# Patient Record
Sex: Female | Born: 1985 | Race: Black or African American | Hispanic: No | Marital: Single | Smoking: Former smoker
Health system: Southern US, Community
[De-identification: ages and names within clinical notes are randomized; demographics above are authoritative.]

## PROBLEM LIST (undated history)

## (undated) DIAGNOSIS — N83209 Unspecified ovarian cyst, unspecified side: Secondary | ICD-10-CM

## (undated) HISTORY — PX: TUBAL LIGATION: SHX77

---
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
## 2007-12-28 IMAGING — US US OB FOLLOW-UP
1 series · 13 of 28 positions shown · non-contrast
Comparison: 03/17/05 and 01/13/05.

CLINICAL DATA: AFI and growth.

 OBSTETRICAL ULTRASOUND RE-EVALUATION:

[Series 1: us ob follow-up · 0.33mm/px · 13 of 40 slices shown]
[im 2/40]
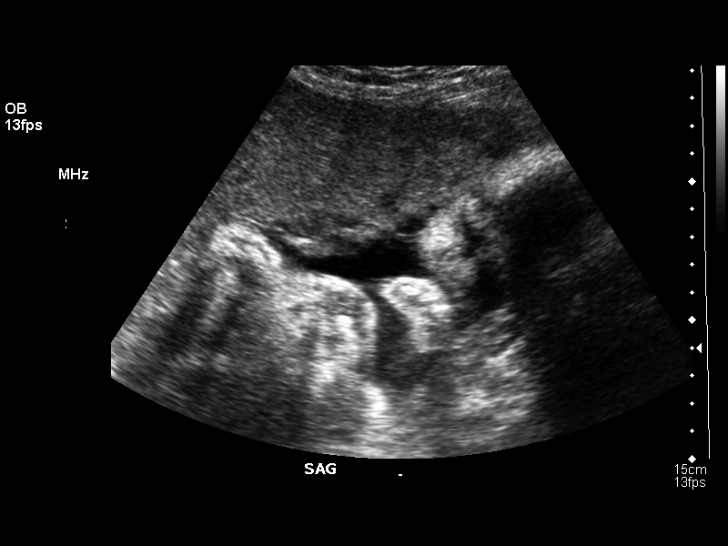
[im 5/40]
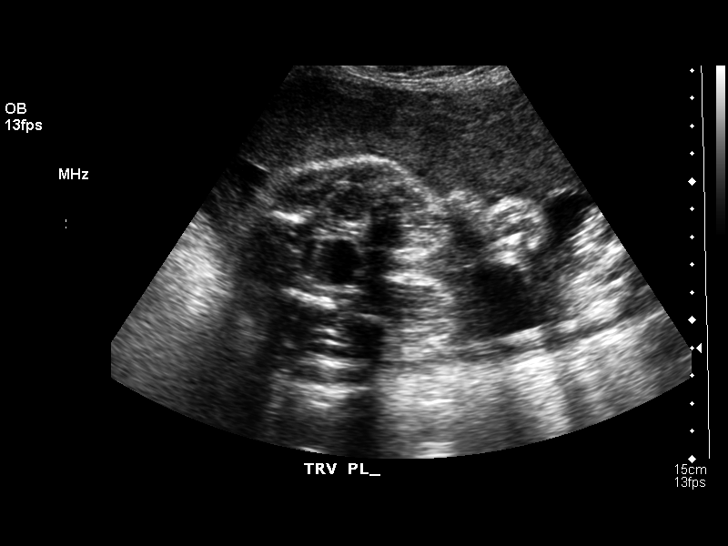
[im 8/40]
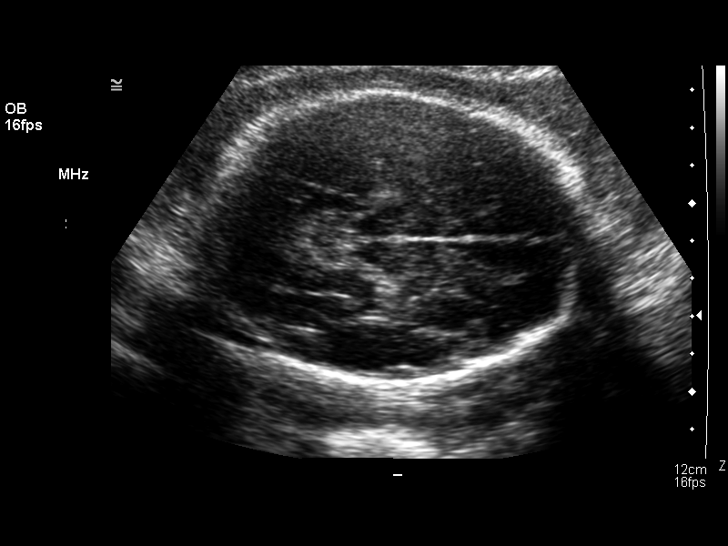
[im 11/40]
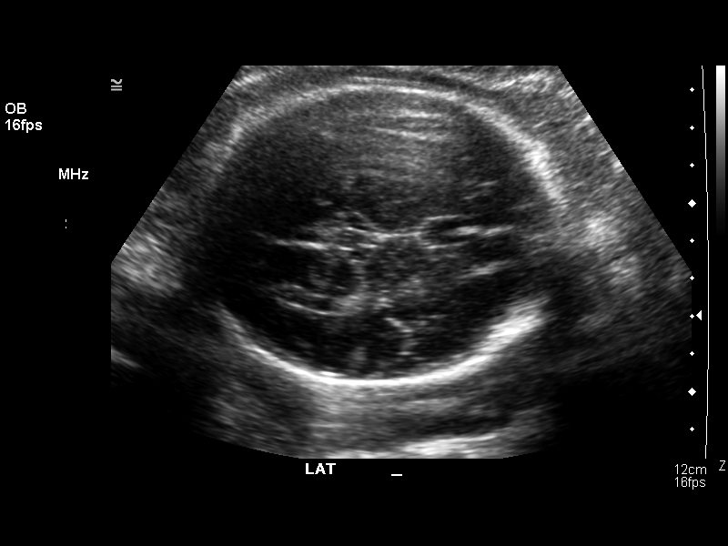
[im 14/40]
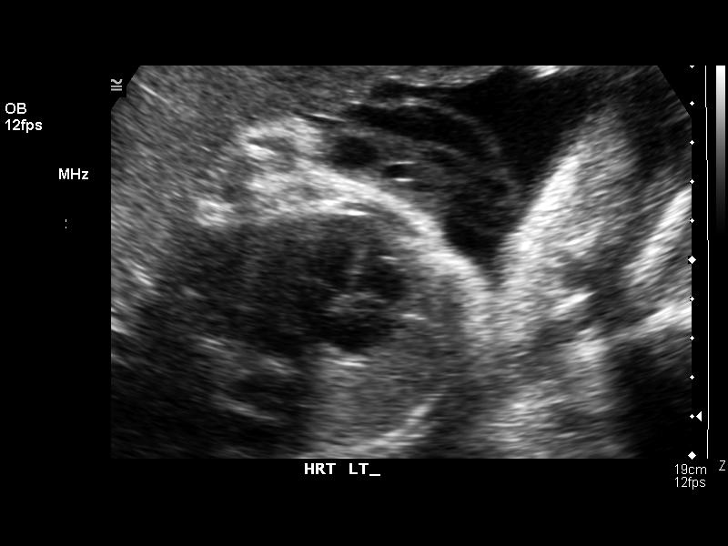
[im 16/40]
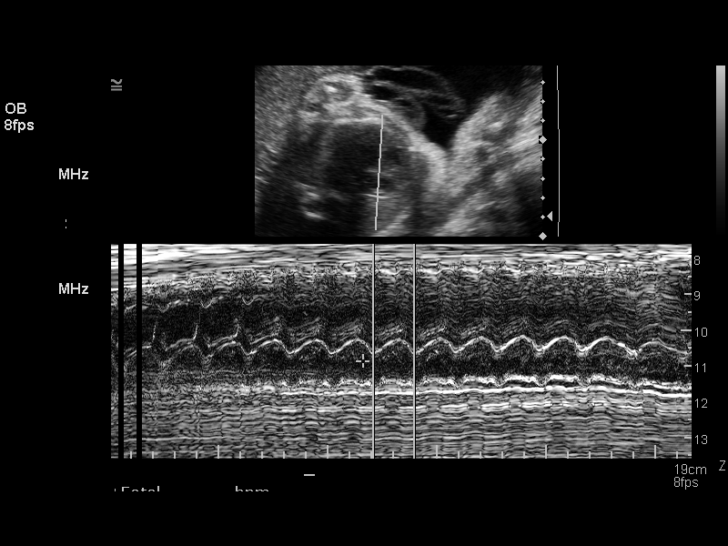
[im 21/40]
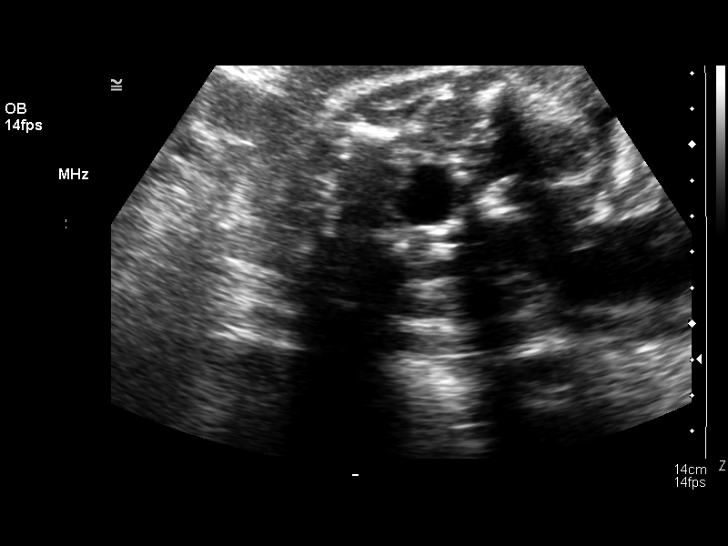
[im 24/40]
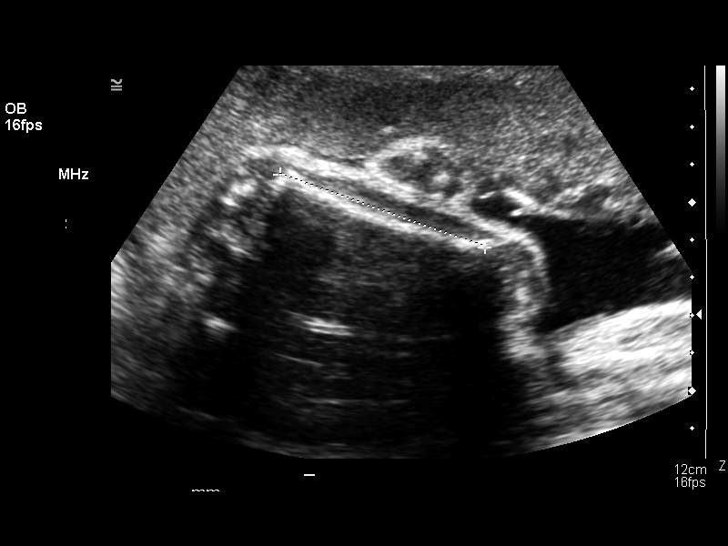
[im 27/40]
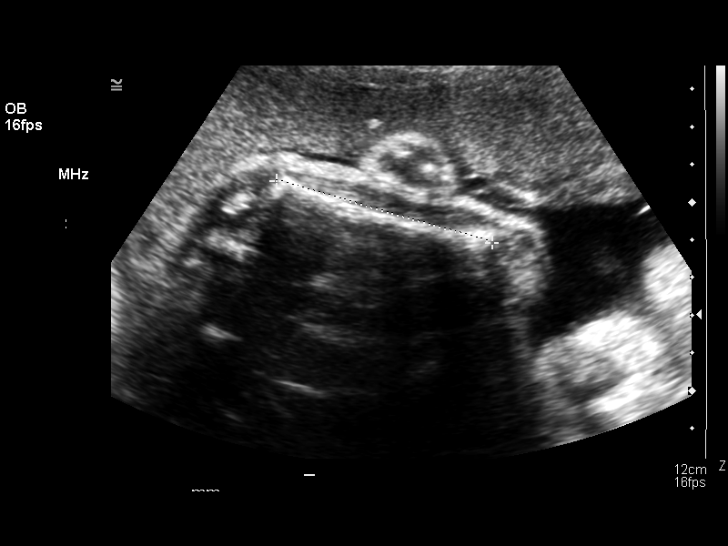
[im 29/40]
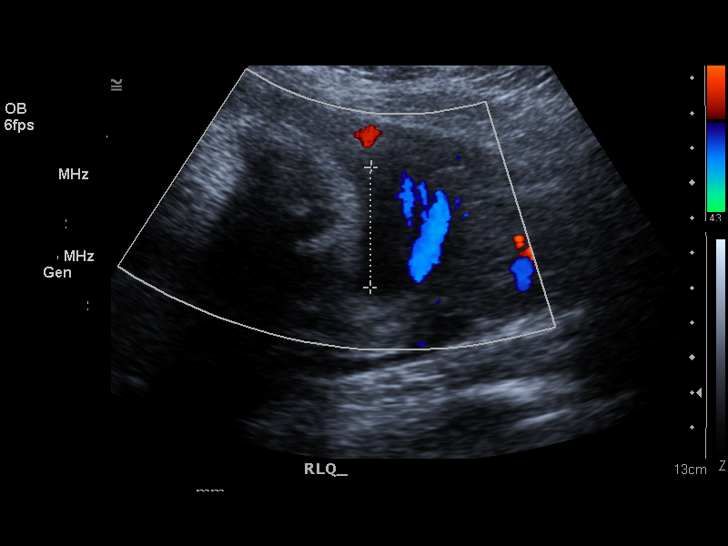
[im 32/40]
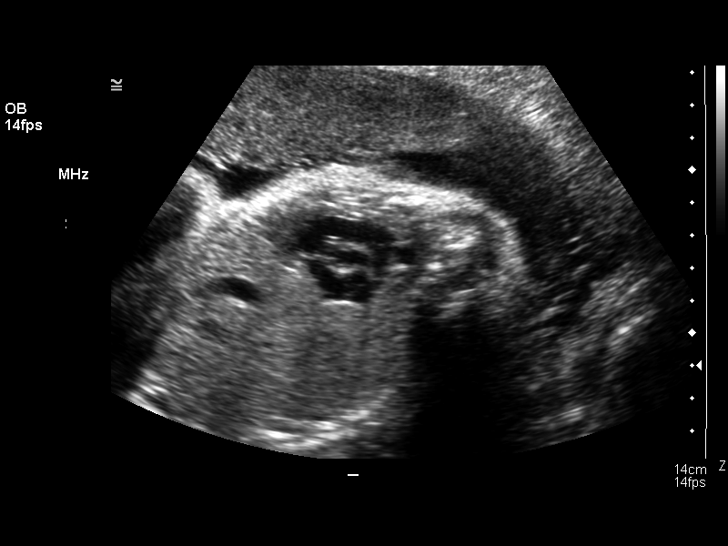
[im 35/40]
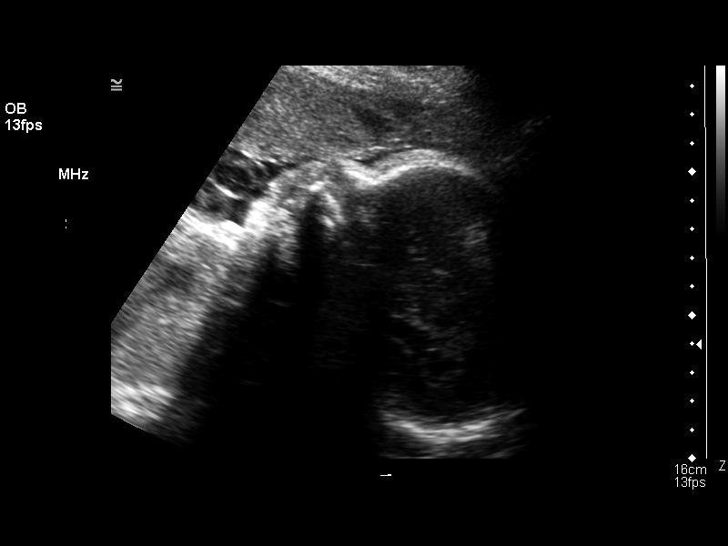
[im 38/40]
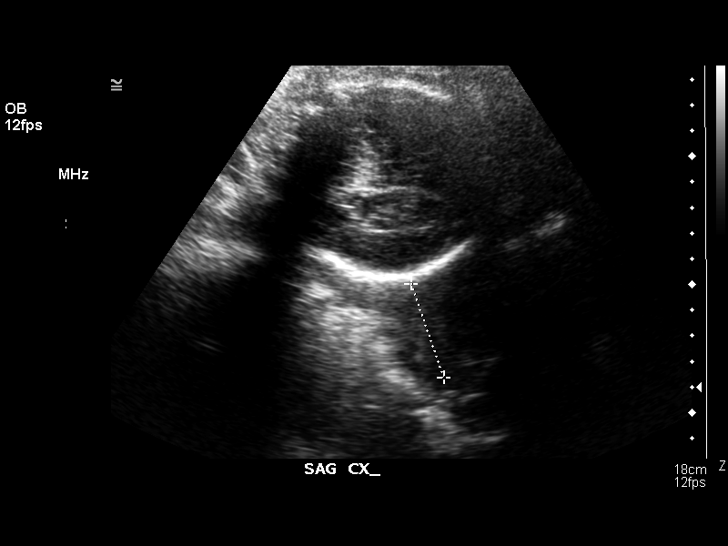

[13 of 28 positions shown; findings below may reference images not displayed]

Number of Fetuses: 1
 Heart Rate:  160
 Movement:  Yes
 Breathing:  No
 Presentation:  Cephalic
 Placental Location:  Anterior
 Grade:  I
 Previa:  No
 Amniotic Fluid (subjective):  Normal
 Amniotic Fluid (objective):  12.6 cm AFI (5th -95th%ile = 8.6 – 24.2 cm for 32 wks )

 FETAL BIOMETRY
 BPD:  7.7 cm   30 w 5 d
 HC:  28.8 cm   31 w 5 d
 AC:  26.4 cm  30 w 4 d
 FL:   5.5 cm  30 w 5 d

 Mean GA:  31 w 0 d  US EDC:  06/09/05
 Assigned GA:  31 w 6 d  Assigned EDC:  06/03/05

 EFW:  6797 g (H) 25th – 50th%ile (7800 – 7051 g) For 32 wks

 FETAL ANATOMY
 Lateral Ventricles:  Visualized 
 Thalami/CSP:  Visualized 
 Posterior Fossa:  Previously seen 
 Nuchal Region:  Previously seen 
 Spine:  Previously seen 
 4 Chamber Heart on Left:  Visualized 
 Stomach on Left:  Visualized 
 3 Vessel Cord:  Visualized 
 Cord Insertion Site:  Visualized 
 Kidneys:  Visualized 
 Bladder:  Visualized 
 Extremities:  Previously seen 

 ADDITIONAL ANATOMY VISUALIZED:  LVOT, RVOT, profile, and diaphragm.

 MATERNAL UTERINE AND ADNEXAL FINDINGS
 Cervix: 4.0 cm Transabdominally
IMPRESSION: 1.  Single living intrauterine fetus in cephalic presentation with subjectively normal amniotic fluid volume.  AFI today is 12.6 cm.  
 2.  Estimated mean gestational age is 31 weeks 0 days which correlates well with the reported assigned gestational age and suggests appropriate growth.  
 3.  Visualized fetal anatomy is unremarkable although assessment is limited by the advanced gestational age.  A nuchal cord was documented on the previous study, but this is less apparent today.

## 2010-02-06 ENCOUNTER — Encounter: Payer: Self-pay | Admitting: *Deleted

## 2015-06-23 ENCOUNTER — Emergency Department: Payer: Self-pay

## 2015-06-23 ENCOUNTER — Emergency Department (HOSPITAL_BASED_OUTPATIENT_CLINIC_OR_DEPARTMENT_OTHER)
Admission: EM | Admit: 2015-06-23 | Discharge: 2015-06-23 | Disposition: A | Discharge status: Home / Self Care | Payer: Self-pay | Attending: Emergency Medicine | Admitting: Emergency Medicine

## 2015-06-23 ENCOUNTER — Emergency Department (HOSPITAL_BASED_OUTPATIENT_CLINIC_OR_DEPARTMENT_OTHER): Payer: Self-pay

## 2015-06-23 ENCOUNTER — Encounter (HOSPITAL_BASED_OUTPATIENT_CLINIC_OR_DEPARTMENT_OTHER): Payer: Self-pay

## 2015-06-23 DIAGNOSIS — F17210 Nicotine dependence, cigarettes, uncomplicated: Secondary | ICD-10-CM | POA: Insufficient documentation

## 2015-06-23 DIAGNOSIS — R109 Unspecified abdominal pain: Secondary | ICD-10-CM

## 2015-06-23 DIAGNOSIS — N390 Urinary tract infection, site not specified: Secondary | ICD-10-CM | POA: Insufficient documentation

## 2015-06-23 DIAGNOSIS — N72 Inflammatory disease of cervix uteri: Secondary | ICD-10-CM | POA: Insufficient documentation

## 2015-06-23 LAB — URINE MICROSCOPIC-ADD ON

## 2015-06-23 LAB — URINALYSIS, ROUTINE W REFLEX MICROSCOPIC
BILIRUBIN URINE: NEGATIVE
GLUCOSE, UA: NEGATIVE mg/dL
KETONES UR: NEGATIVE mg/dL
Nitrite: NEGATIVE
PROTEIN: NEGATIVE mg/dL
Specific Gravity, Urine: 1.018 (ref 1.005–1.030)
pH: 7.5 (ref 5.0–8.0)

## 2015-06-23 LAB — PREGNANCY, URINE: Preg Test, Ur: NEGATIVE

## 2015-06-23 LAB — WET PREP, GENITAL
Clue Cells Wet Prep HPF POC: NONE SEEN
SPERM: NONE SEEN
TRICH WET PREP: NONE SEEN
Yeast Wet Prep HPF POC: NONE SEEN

## 2015-06-23 MED ORDER — LIDOCAINE HCL (PF) 1 % IJ SOLN
INTRAMUSCULAR | Status: AC
Start: 2015-06-23 — End: 2015-06-23
  Administered 2015-06-23: 0.9 mL
  Filled 2015-06-23: qty 5

## 2015-06-23 MED ORDER — ONDANSETRON 4 MG PO TBDP
4.0000 mg | ORAL_TABLET | Freq: Once | ORAL | Status: AC
  Administered 2015-06-23: 4 mg via ORAL
  Filled 2015-06-23: qty 1

## 2015-06-23 MED ORDER — CEPHALEXIN 500 MG PO CAPS: 500.0000 mg | ORAL_CAPSULE | Freq: Four times a day (QID) | ORAL | Status: DC

## 2015-06-23 MED ORDER — CEFTRIAXONE SODIUM 250 MG IJ SOLR
250.0000 mg | Freq: Once | INTRAMUSCULAR | Status: AC
  Administered 2015-06-23: 250 mg via INTRAMUSCULAR
  Filled 2015-06-23: qty 250

## 2015-06-23 MED ORDER — AZITHROMYCIN 250 MG PO TABS
1000.0000 mg | ORAL_TABLET | Freq: Once | ORAL | Status: AC
  Administered 2015-06-23: 1000 mg via ORAL
  Filled 2015-06-23: qty 4

## 2015-06-23 NOTE — ED Notes (Signed)
Patient states she is having vaginal pain. States that she had sexual intercourse 2 days ago and has pain since that time. Patient reports bleeding during sex. It is previously documented that the patient is on her menstrual cycle, patient denies that at this time.

## 2015-06-23 NOTE — ED Notes (Signed)
Patient transported to Ultrasound 

## 2015-06-23 NOTE — ED Notes (Signed)
Patient complains of 2 days of vaginal pain following sex. States bleeding with same but stats sh on her menstrual cycle. denies dysuria

## 2015-06-23 NOTE — ED Provider Notes (Signed)
CSN: [id number]     Arrival date & time 06/23/15  1418 History   First MD Initiated Contact with Patient 06/23/15 1508     Chief Complaint  Patient presents with  . Vaginal Pain     (Consider location/radiation/quality/duration/timing/severity/associated sxs/prior Treatment) HPI Comments: 30 year old female G7 P5 presents for pain with intercourse as well as bleeding after intercourse. The patient reports this is been ongoing over the last 2 days. She reports that she had a normal menses 1 week ago that ended a few days before the bleeding with intercourse began. She reports mild suprapubic discomfort. Denies abdominal pain. Denies fevers or chills. Reports normal bowel movements. Normal appetite and diet. Denies pain with urination or urgency or frequency.  Patient is a 30 y.o. female presenting with vaginal pain.  Vaginal Pain Pertinent negatives include no chest pain, no abdominal pain, no headaches and no shortness of breath.    History reviewed. No pertinent past medical history. History reviewed. No pertinent past surgical history. No family history on file. Social History  Substance Use Topics  . Smoking status: Current Every Day Smoker    Types: Cigarettes  . Smokeless tobacco: None  . Alcohol Use: None   OB History    No data available     Review of Systems  Constitutional: Negative for chills and fatigue.  HENT: Negative for congestion and rhinorrhea.   Eyes: Negative for visual disturbance.  Respiratory: Negative for cough, chest tightness and shortness of breath.   Cardiovascular: Negative for chest pain and palpitations.  Gastrointestinal: Negative for nausea, vomiting, abdominal pain and diarrhea.  Genitourinary: Positive for vaginal pain and pelvic pain. Negative for dysuria, urgency, frequency, flank pain, vaginal bleeding and vaginal discharge.  Musculoskeletal: Negative for myalgias and back pain.  Skin: Negative for rash.  Neurological: Negative for  dizziness, weakness, light-headedness and headaches.  Hematological: Does not bruise/bleed easily.      Allergies  Review of patient's allergies indicates no known allergies.  Home Medications   Prior to Admission medications   Medication Sig Start Date End Date Taking? Authorizing Provider  cephALEXin (KEFLEX) 500 MG capsule Take 1 capsule (500 mg total) by mouth 4 (four) times daily. 06/23/15   Leta BaptistEmily Roe Nguyen, MD   BP 94/62 mmHg  Pulse 60  Temp(Src) 99.3 F (37.4 C)  Resp 16  Ht 5\' 5"  (1.651 m)  Wt 200 lb (90.719 kg)  BMI 33.28 kg/m2  SpO2 100%  LMP 06/16/2015 (Approximate) Physical Exam  Constitutional: She is oriented to person, place, and time. She appears well-developed and well-nourished. No distress.  HENT:  Head: Normocephalic and atraumatic.  Right Ear: External ear normal.  Left Ear: External ear normal.  Nose: Nose normal.  Mouth/Throat: Oropharynx is clear and moist. No oropharyngeal exudate.  Eyes: EOM are normal. Pupils are equal, round, and reactive to light.  Neck: Normal range of motion. Neck supple.  Cardiovascular: Normal rate, regular rhythm, normal heart sounds and intact distal pulses.   No murmur heard. Pulmonary/Chest: Effort normal. No respiratory distress. She has no wheezes. She has no rales.  Abdominal: Soft. She exhibits no distension. There is no tenderness. Hernia confirmed negative in the right inguinal area and confirmed negative in the left inguinal area.  Genitourinary: There is no tenderness on the right labia. There is no tenderness on the left labia. Uterus is not enlarged. Cervix exhibits motion tenderness. Cervix exhibits no discharge. Right adnexum displays tenderness. Right adnexum displays no mass and no fullness. Left adnexum  displays no mass, no tenderness and no fullness. There is tenderness and bleeding (scant blood in the vaginal vault) in the vagina. No foreign body around the vagina. No signs of injury around the vagina. No  vaginal discharge found.  Musculoskeletal: Normal range of motion. She exhibits no edema or tenderness.  Neurological: She is alert and oriented to person, place, and time.  Skin: Skin is warm and dry. No rash noted. She is not diaphoretic.  Vitals reviewed.   ED Course  Procedures (including critical care time) Labs Review Labs Reviewed  WET PREP, GENITAL - Abnormal; Notable for the following:    WBC, Wet Prep HPF POC MANY (*)    All other components within normal limits  URINALYSIS, ROUTINE W REFLEX MICROSCOPIC (NOT AT [facility]) - Abnormal; Notable for the following:    APPearance CLOUDY (*)    Hgb urine dipstick TRACE (*)    Leukocytes, UA SMALL (*)    All other components within normal limits  URINE MICROSCOPIC-ADD ON - Abnormal; Notable for the following:    Squamous Epithelial / LPF 0-5 (*)    Bacteria, UA MANY (*)    All other components within normal limits  PREGNANCY, URINE  HIV ANTIBODY (ROUTINE TESTING)  GC/CHLAMYDIA PROBE AMP ([place]) NOT AT [facility]    Imaging Review US Transvaginal Non-ob  06/23/2015  CLINICAL DATA:  RIGHT adnexal pain on pelvic exam today, vaginal pain since intercourse 2 days ago, some bleeding, initial encounter EXAM: TRANSABDOMINAL AND TRANSVAGINAL ULTRASOUND OF PELVIS TECHNIQUE: Both transabdominal and transvaginal ultrasound examinations of the pelvis were performed. Transabdominal technique was performed for global imaging of the pelvis including uterus, ovaries, adnexal regions, and pelvic cul-de-sac. It was necessary to proceed with endovaginal exam following the transabdominal exam to visualize the endometrium and RIGHT ovary. COMPARISON:  None FINDINGS: Uterus Measurements: 8.7 x 4.1 x 4.5 cm. Heterogeneous myometrial echogenicity without focal mass. Endometrium Thickness: 5 mm thick, normal. No endometrial fluid or focal abnormality Right ovary Measurements: 4.1 x 2.5 x 2.7 cm. Numerous peripheral follicles, nonspecific but has been described in  patients with polycystic ovary disease. No dominant RIGHT ovarian mass. Left ovary Measurements: 3.4 x 2.2 x 2.3 cm. Numerous peripheral follicles though less prominent than on RIGHT. No dominant mass. Other findings No free fluid or adnexal masses. IMPRESSION: Unremarkable uterus and endometrium. Numerous peripheral follicles in the ovaries, slightly more prominent on RIGHT, nonspecific but has been described in patients with polycystic ovarian disease. Electronically Signed   By: Ulyses Southward M.D.   On: 06/23/2015 17:41   US Pelvis Complete  06/23/2015  CLINICAL DATA:  RIGHT adnexal pain on pelvic exam today, vaginal pain since intercourse 2 days ago, some bleeding, initial encounter EXAM: TRANSABDOMINAL AND TRANSVAGINAL ULTRASOUND OF PELVIS TECHNIQUE: Both transabdominal and transvaginal ultrasound examinations of the pelvis were performed. Transabdominal technique was performed for global imaging of the pelvis including uterus, ovaries, adnexal regions, and pelvic cul-de-sac. It was necessary to proceed with endovaginal exam following the transabdominal exam to visualize the endometrium and RIGHT ovary. COMPARISON:  None FINDINGS: Uterus Measurements: 8.7 x 4.1 x 4.5 cm. Heterogeneous myometrial echogenicity without focal mass. Endometrium Thickness: 5 mm thick, normal. No endometrial fluid or focal abnormality Right ovary Measurements: 4.1 x 2.5 x 2.7 cm. Numerous peripheral follicles, nonspecific but has been described in patients with polycystic ovary disease. No dominant RIGHT ovarian mass. Left ovary Measurements: 3.4 x 2.2 x 2.3 cm. Numerous peripheral follicles though less prominent than on RIGHT. No  dominant mass. Other findings No free fluid or adnexal masses. IMPRESSION: Unremarkable uterus and endometrium. Numerous peripheral follicles in the ovaries, slightly more prominent on RIGHT, nonspecific but has been described in patients with polycystic ovarian disease. Electronically Signed   By: Ulyses Southward M.D.   On: 06/23/2015 17:41   I have personally reviewed and evaluated these images and lab results as part of my medical decision-making.   EKG Interpretation None      MDM  Patient seen and evaluated in stable condition. Examination consistent with cervicitis. Concern for tenderness on the right adnexa. Ultrasound showed numerous peripheral follicles in the ovaries slightly more prominent on right than left but otherwise were unremarkable. UA concerning for possible infection. Patient was treated for STDs with Rocephin and azithromycin. Results were discussed at bedside with her. She was discharged home in stable condition with a prescription for Keflex. Return precautions and instructed to inform her sexual partners that she was evaluated and treated for STDs. Final diagnoses:  Cervicitis  UTI (lower urinary tract infection)    1. Cervicitis 2. UTI    Leta Baptist, MD 06/24/15 0111

## 2015-06-23 NOTE — Discharge Instructions (Signed)
You were seen and evaluated today for your pain with intercourse and bleeding. Likely this is related to infection in your cervix. Most likely this is secondary to a sexually transmitted disease. You have been treated for sexually transmitted diseases. Your cultures will take 2-3 days to result. Please inform any sexual partners that he were evaluated for this and that they need to be evaluated as well. You are also found on your ultrasound to have cysts on her ovary. Please follow-up within OB/GYN regarding this as this will need further workup outpatient. You should also address the pain with intercourse and bleeding with them if it continues.  Cervicitis Cervicitis is a soreness and swelling (inflammation) of the cervix. Your cervix is located at the bottom of your uterus. It opens up to the vagina. CAUSES   Sexually transmitted infections (STIs).   Allergic reaction.   Medicines or birth control devices that are put in the vagina.   Injury to the cervix.   Bacterial infections.  RISK FACTORS You are at greater risk if you:  Have unprotected sexual intercourse.  Have sexual intercourse with many partners.  Began sexual intercourse at an early age.  Have a history of STIs. SYMPTOMS  There may be no symptoms. If symptoms occur, they may include:   Gray, white, yellow, or bad-smelling vaginal discharge.   Pain or itching of the area outside the vagina.   Painful sexual intercourse.   Lower abdominal or lower back pain, especially during intercourse.   Frequent urination.   Abnormal vaginal bleeding between periods, after sexual intercourse, or after menopause.   Pressure or a heavy feeling in the pelvis.  DIAGNOSIS  Diagnosis is made after a pelvic exam. Other tests may include:   Examination of any discharge under a microscope (wet prep).   A Pap test.  TREATMENT  Treatment will depend on the cause of cervicitis. If it is caused by an STI, both you and  your partner will need to be treated. Antibiotic medicines will be given.  HOME CARE INSTRUCTIONS   Do not have sexual intercourse until your health care provider says it is okay.   Do not have sexual intercourse until your partner has been treated, if your cervicitis is caused by an STI.   Take your antibiotics as directed. Finish them even if you start to feel better.  SEEK MEDICAL CARE IF:  Your symptoms come back.   You have a fever.  MAKE SURE YOU:   Understand these instructions.  Will watch your condition.  Will get help right away if you are not doing well or get worse.   This information is not intended to replace advice given to you by your health care provider. Make sure you discuss any questions you have with your health care provider.   Document Released: 01/02/2005 Document Revised: 01/07/2013 Document Reviewed: 06/26/2012 Elsevier Interactive Patient Education 2016 Elsevier Inc.  Urinary Tract Infection Urinary tract infections (UTIs) can develop anywhere along your urinary tract. Your urinary tract is your body's drainage system for removing wastes and extra water. Your urinary tract includes two kidneys, two ureters, a bladder, and a urethra. Your kidneys are a pair of bean-shaped organs. Each kidney is about the size of your fist. They are located below your ribs, one on each side of your spine. CAUSES Infections are caused by microbes, which are microscopic organisms, including fungi, viruses, and bacteria. These organisms are so small that they can only be seen through a microscope. Bacteria  are the microbes that most commonly cause UTIs. SYMPTOMS  Symptoms of UTIs may vary by age and gender of the patient and by the location of the infection. Symptoms in young women typically include a frequent and intense urge to urinate and a painful, burning feeling in the bladder or urethra during urination. Older women and men are more likely to be tired, shaky, and weak  and have muscle aches and abdominal pain. A fever may mean the infection is in your kidneys. Other symptoms of a kidney infection include pain in your back or sides below the ribs, nausea, and vomiting. DIAGNOSIS To diagnose a UTI, your caregiver will ask you about your symptoms. Your caregiver will also ask you to provide a urine sample. The urine sample will be tested for bacteria and white blood cells. White blood cells are made by your body to help fight infection. TREATMENT  Typically, UTIs can be treated with medication. Because most UTIs are caused by a bacterial infection, they usually can be treated with the use of antibiotics. The choice of antibiotic and length of treatment depend on your symptoms and the type of bacteria causing your infection. HOME CARE INSTRUCTIONS  If you were prescribed antibiotics, take them exactly as your caregiver instructs you. Finish the medication even if you feel better after you have only taken some of the medication.  Drink enough water and fluids to keep your urine clear or pale yellow.  Avoid caffeine, tea, and carbonated beverages. They tend to irritate your bladder.  Empty your bladder often. Avoid holding urine for long periods of time.  Empty your bladder before and after sexual intercourse.  After a bowel movement, women should cleanse from front to back. Use each tissue only once. SEEK MEDICAL CARE IF:   You have back pain.  You develop a fever.  Your symptoms do not begin to resolve within 3 days. SEEK IMMEDIATE MEDICAL CARE IF:   You have severe back pain or lower abdominal pain.  You develop chills.  You have nausea or vomiting.  You have continued burning or discomfort with urination. MAKE SURE YOU:   Understand these instructions.  Will watch your condition.  Will get help right away if you are not doing well or get worse.   This information is not intended to replace advice given to you by your health care provider.  Make sure you discuss any questions you have with your health care provider.   Document Released: 10/12/2004 Document Revised: 09/23/2014 Document Reviewed: 02/10/2011 Elsevier Interactive Patient Education [employer]2016 Elsevier Inc.

## 2015-06-24 LAB — GC/CHLAMYDIA PROBE AMP (~~LOC~~) NOT AT ARMC
Chlamydia: NEGATIVE
Neisseria Gonorrhea: NEGATIVE

## 2015-06-24 LAB — HIV ANTIBODY (ROUTINE TESTING W REFLEX): HIV Screen 4th Generation wRfx: NONREACTIVE

## 2015-09-23 ENCOUNTER — Encounter (HOSPITAL_COMMUNITY): Payer: Self-pay | Admitting: Emergency Medicine

## 2015-09-23 ENCOUNTER — Emergency Department (HOSPITAL_COMMUNITY)
Admission: EM | Admit: 2015-09-23 | Discharge: 2015-09-23 | Disposition: A | Discharge status: Home / Self Care | Payer: Self-pay | Attending: Emergency Medicine | Admitting: Emergency Medicine

## 2015-09-23 DIAGNOSIS — A599 Trichomoniasis, unspecified: Secondary | ICD-10-CM | POA: Insufficient documentation

## 2015-09-23 DIAGNOSIS — F17210 Nicotine dependence, cigarettes, uncomplicated: Secondary | ICD-10-CM | POA: Insufficient documentation

## 2015-09-23 LAB — URINALYSIS, ROUTINE W REFLEX MICROSCOPIC
Bilirubin Urine: NEGATIVE
GLUCOSE, UA: NEGATIVE mg/dL
Ketones, ur: NEGATIVE mg/dL
Nitrite: NEGATIVE
Protein, ur: NEGATIVE mg/dL
SPECIFIC GRAVITY, URINE: 1.022 (ref 1.005–1.030)
pH: 6.5 (ref 5.0–8.0)

## 2015-09-23 LAB — URINE MICROSCOPIC-ADD ON

## 2015-09-23 LAB — WET PREP, GENITAL
Sperm: NONE SEEN
Yeast Wet Prep HPF POC: NONE SEEN

## 2015-09-23 MED ORDER — CEFTRIAXONE SODIUM 250 MG IJ SOLR
250.0000 mg | Freq: Once | INTRAMUSCULAR | Status: AC
Start: 2015-09-23 — End: 2015-09-23
  Administered 2015-09-23: 250 mg via INTRAMUSCULAR
  Filled 2015-09-23: qty 250

## 2015-09-23 MED ORDER — METRONIDAZOLE 500 MG PO TABS: 500.0000 mg | ORAL_TABLET | Freq: Two times a day (BID) | ORAL | 0 refills | Status: DC

## 2015-09-23 MED ORDER — STERILE WATER FOR INJECTION IJ SOLN
0.9000 mL | INTRAMUSCULAR | Status: AC
  Administered 2015-09-23: 0.9 mL via INTRAMUSCULAR

## 2015-09-23 MED ORDER — STERILE WATER FOR INJECTION IJ SOLN
INTRAMUSCULAR | Status: AC
  Filled 2015-09-23: qty 10

## 2015-09-23 MED ORDER — DOXYCYCLINE HYCLATE 100 MG PO CAPS: 100.0000 mg | ORAL_CAPSULE | Freq: Two times a day (BID) | ORAL | 0 refills | Status: DC

## 2015-09-23 NOTE — ED Triage Notes (Signed)
Patient states abdominal pain x 2 days.   Patient states some white vaginal discharge with pain.   Denies other symptoms.

## 2015-09-23 NOTE — ED Notes (Signed)
Crackers given to pt ?

## 2015-09-23 NOTE — ED Notes (Signed)
PA and Resident MD at bedside.

## 2015-09-23 NOTE — Discharge Instructions (Signed)
Return here as needed.  Follow-up with the clinic provided.  °

## 2015-09-24 LAB — GC/CHLAMYDIA PROBE AMP (~~LOC~~) NOT AT ARMC
Chlamydia: NEGATIVE
Neisseria Gonorrhea: NEGATIVE

## 2015-09-24 NOTE — ED Provider Notes (Signed)
[facility]   CSN: [id number] Arrival date & time: 09/23/15  1004     History   Chief Complaint Chief Complaint  Patient presents with  . Abdominal Pain    HPI Taylor Butler is a 30 y.o. female.  HPI   Patient presents to the emergency department with lower pelvic pain with vaginal discharge.  The patient states that this started 3 days ago.  The patient states she was recently seen for similar symptoms.  The patient states that nothing seems make the condition better or worse.  She did not take any medications prior to arrival.  She denies any vaginal bleeding.  The patient denies chest pain, shortness of breath, headache,blurred vision, neck pain, fever, cough, weakness, numbness, dizziness, anorexia, edema, , nausea, vomiting, diarrhea, rash, back pain, dysuria, hematemesis, bloody stool, near syncope, or syncope. History reviewed. No pertinent past medical history.  There are no active problems to display for this patient.   Past Surgical History:  Procedure Laterality Date  . CESAREAN SECTION    . TUBAL LIGATION      OB History    No data available       Home Medications    Prior to Admission medications   Medication Sig Start Date End Date Taking? Authorizing Provider  cephALEXin (KEFLEX) 500 MG capsule Take 1 capsule (500 mg total) by mouth 4 (four) times daily. Patient not taking: Reported on 09/23/2015 06/23/15   Leta BaptistEmily Roe Nguyen, MD  doxycycline (VIBRAMYCIN) 100 MG capsule Take 1 capsule (100 mg total) by mouth 2 (two) times daily. 09/23/15   Charlestine Nighthristopher Taran Haynesworth, PA-C  metroNIDAZOLE (FLAGYL) 500 MG tablet Take 1 tablet (500 mg total) by mouth 2 (two) times daily. 09/23/15   Charlestine Nighthristopher Leyana Whidden, PA-C    Family History No family history on file.  Social History Social History  Substance Use Topics  . Smoking status: Current Some Day Smoker    Types: Cigars  . Smokeless tobacco: Not on file  . Alcohol use No     Allergies   Review  of patient's allergies indicates no known allergies.   Review of Systems Review of Systems All other systems negative except as documented in the HPI. All pertinent positives and negatives as reviewed in the HPI.=  Physical Exam Updated Vital Signs BP 106/71 (BP Location: Right Arm)   Pulse 73   Temp 98.4 F (36.9 C) (Oral)   Resp 16   Ht 5\' 5"  (1.651 m)   Wt 94.9 kg   LMP 08/20/2015   SpO2 100%   BMI 34.81 kg/m   Physical Exam  Constitutional: She is oriented to person, place, and time. She appears well-developed and well-nourished. No distress.  HENT:  Head: Normocephalic and atraumatic.  Mouth/Throat: Oropharynx is clear and moist.  Eyes: Pupils are equal, round, and reactive to light.  Neck: Normal range of motion. Neck supple.  Cardiovascular: Normal rate, regular rhythm and normal heart sounds.  Exam reveals no gallop and no friction rub.   No murmur heard. Pulmonary/Chest: Effort normal and breath sounds normal. No respiratory distress. She has no wheezes.  Abdominal: Soft. Bowel sounds are normal. She exhibits no distension. There is no tenderness.  Genitourinary: Cervix exhibits discharge. Cervix exhibits no motion tenderness and no friability. Right adnexum displays tenderness. Left adnexum displays tenderness. Vaginal discharge found.  Neurological: She is alert and oriented to person, place, and time. She exhibits normal muscle tone. Coordination normal.  Skin: Skin is warm and  dry. No rash noted. No erythema.  Psychiatric: She has a normal mood and affect. Her behavior is normal.  Nursing note and vitals reviewed.    ED Treatments / Results  Labs (all labs ordered are listed, but only abnormal results are displayed) Labs Reviewed  WET PREP, GENITAL - Abnormal; Notable for the following:       Result Value   Trich, Wet Prep PRESENT (*)    Clue Cells Wet Prep HPF POC PRESENT (*)    WBC, Wet Prep HPF POC MODERATE (*)    All other components within normal  limits  URINALYSIS, ROUTINE W REFLEX MICROSCOPIC (NOT AT [facility]) - Abnormal; Notable for the following:    APPearance TURBID (*)    Hgb urine dipstick MODERATE (*)    Leukocytes, UA MODERATE (*)    All other components within normal limits  URINE MICROSCOPIC-ADD ON - Abnormal; Notable for the following:    Squamous Epithelial / LPF 6-30 (*)    Bacteria, UA MANY (*)    All other components within normal limits  GC/CHLAMYDIA PROBE AMP ([place]) NOT AT [facility]    EKG  EKG Interpretation None       Radiology No results found.  Procedures Procedures (including critical care time)  Medications Ordered in ED Medications  cefTRIAXone (ROCEPHIN) injection 250 mg (250 mg Intramuscular Given 09/23/15 1516)  sterile water (preservative free) injection 0.9 mL (0.9 mLs Injection Given 09/23/15 1517)     Initial Impression / Assessment and Plan / ED Course  I have reviewed the triage vital signs and the nursing notes.  Pertinent labs & imaging results that were available during my care of the patient were reviewed by me and considered in my medical decision making (see chart for details).  Clinical Course    Patient be treated for STDs.  Told to return here as needed.  Also treated for PID based on the fact that she has lower pelvic pain on exam  Final Clinical Impressions(s) / ED Diagnoses   Final diagnoses:  Trichomoniasis    New Prescriptions Discharge Medication List as of 09/23/2015  3:44 PM    START taking these medications   Details  doxycycline (VIBRAMYCIN) 100 MG capsule Take 1 capsule (100 mg total) by mouth 2 (two) times daily., Starting Thu 09/23/2015, Print    metroNIDAZOLE (FLAGYL) 500 MG tablet Take 1 tablet (500 mg total) by mouth 2 (two) times daily., Starting Thu 09/23/2015, Print         [employer], PA-C 09/24/15 1724    Linwood Dibbles, MD 09/26/15 [phone]

## 2016-08-25 ENCOUNTER — Inpatient Hospital Stay (HOSPITAL_COMMUNITY): Payer: Self-pay

## 2016-08-25 ENCOUNTER — Encounter (HOSPITAL_COMMUNITY): Payer: Self-pay

## 2016-08-25 ENCOUNTER — Inpatient Hospital Stay (HOSPITAL_COMMUNITY)
Admission: AD | Admit: 2016-08-25 | Discharge: 2016-08-25 | Disposition: A | Discharge status: Home / Self Care | Payer: Self-pay | Source: Ambulatory Visit | Attending: Family Medicine | Admitting: Family Medicine

## 2016-08-25 DIAGNOSIS — R102 Pelvic and perineal pain: Secondary | ICD-10-CM | POA: Insufficient documentation

## 2016-08-25 DIAGNOSIS — F17290 Nicotine dependence, other tobacco product, uncomplicated: Secondary | ICD-10-CM | POA: Insufficient documentation

## 2016-08-25 DIAGNOSIS — N83201 Unspecified ovarian cyst, right side: Secondary | ICD-10-CM | POA: Insufficient documentation

## 2016-08-25 LAB — URINALYSIS, ROUTINE W REFLEX MICROSCOPIC
BILIRUBIN URINE: NEGATIVE
GLUCOSE, UA: NEGATIVE mg/dL
KETONES UR: NEGATIVE mg/dL
NITRITE: NEGATIVE
PH: 5 (ref 5.0–8.0)
Protein, ur: NEGATIVE mg/dL
Specific Gravity, Urine: 1.018 (ref 1.005–1.030)

## 2016-08-25 LAB — POCT PREGNANCY, URINE: Preg Test, Ur: NEGATIVE

## 2016-08-25 MED ORDER — IBUPROFEN 800 MG PO TABS: 800.0000 mg | ORAL_TABLET | Freq: Three times a day (TID) | ORAL | 0 refills | Status: DC | PRN

## 2016-08-25 MED ORDER — KETOROLAC TROMETHAMINE 60 MG/2ML IM SOLN
60.0000 mg | Freq: Once | INTRAMUSCULAR | Status: AC
Start: 2016-08-25 — End: 2016-08-25
  Administered 2016-08-25: 60 mg via INTRAMUSCULAR
  Filled 2016-08-25: qty 2

## 2016-08-25 NOTE — Discharge Instructions (Signed)
Ovarian Cyst °An ovarian cyst is a fluid-filled sac on an ovary. The ovaries are organs that make eggs in women. Most ovarian cysts go away on their own and are not cancerous (are benign). Some cysts need treatment. °Follow these instructions at home: °· Take over-the-counter and prescription medicines only as told by your doctor. °· Do not drive or use heavy machinery while taking prescription pain medicine. °· Get pelvic exams and Pap tests as often as told by your doctor. °· Return to your normal activities as told by your doctor. Ask your doctor what activities are safe for you. °· Do not use any products that contain nicotine or tobacco, such as cigarettes and e-cigarettes. If you need help quitting, ask your doctor. °· Keep all follow-up visits as told by your doctor. This is important. °Contact a doctor if: °· Your periods are: °¨ Late. °¨ Irregular. °¨ Painful. °· Your periods stop. °· You have pelvic pain that does not go away. °· You have pressure on your bladder. °· You have trouble making your bladder empty when you pee (urinate). °· You have pain during sex. °· You have any of the following in your belly (abdomen): °¨ A feeling of fullness. °¨ Pressure. °¨ Discomfort. °¨ Pain that does not go away. °¨ Swelling. °· You feel sick most of the time. °· You have trouble pooping (have constipation). °· You are not as hungry as usual (you lose your appetite). °· You get very bad acne. °· You start to have more hair on your body and face. °· You are gaining weight or losing weight without changing your exercise and eating habits. °· You think you may be pregnant. °Get help right away if: °· You have belly pain that is very bad or gets worse. °· You cannot eat or drink without throwing up (vomiting). °· You suddenly get a fever. °· Your period is a lot heavier than usual. °This information is not intended to replace advice given to you by your health care provider. Make sure you discuss any questions you have  with your health care provider. °Document Released: 06/21/2007 Document Revised: 07/23/2015 Document Reviewed: 06/06/2015 °Elsevier Interactive Patient Education © 2017 Elsevier Inc. ° °

## 2016-08-25 NOTE — MAU Note (Signed)
Patient refused vaginal swabs stating she was tested for STIs 2 weeks ago, no new partners, orders discontinued.

## 2016-08-25 NOTE — MAU Provider Note (Signed)
History     CSN: [id number]  Arrival date and time: 08/25/16 [id number]   First Provider Initiated Contact with Patient 08/25/16 [phone]      Chief Complaint  Patient presents with  . Pelvic Pain   Pelvic Pain  The patient's primary symptoms include pelvic pain. The patient's pertinent negatives include no vaginal discharge. This is a new problem. The current episode started in the past 7 days. The problem occurs intermittently. Pain severity now: 8/10. The problem affects the left side. She is not pregnant. Associated symptoms include vomiting. Pertinent negatives include no chills, dysuria, fever, frequency, nausea or urgency. The vaginal discharge was normal. There has been no bleeding. Nothing aggravates the symptoms. She has tried acetaminophen for the symptoms. The treatment provided mild relief. She is sexually active. She uses nothing for contraception. Her menstrual history has been regular (LMP 07/17/16 ).   No past medical history on file.  Past Surgical History:  Procedure Laterality Date  . CESAREAN SECTION    . TUBAL LIGATION      No family history on file.  Social History  Substance Use Topics  . Smoking status: Current Some Day Smoker    Types: Cigars  . Smokeless tobacco: Never Used  . Alcohol use No    Allergies: No Known Allergies  No prescriptions prior to admission.    Review of Systems  Constitutional: Negative for chills and fever.  Gastrointestinal: Positive for vomiting. Negative for nausea.  Genitourinary: Positive for pelvic pain. Negative for dysuria, frequency, urgency, vaginal bleeding and vaginal discharge.   Physical Exam   Blood pressure 114/78, pulse 72, temperature 98.2 F (36.8 C), temperature source Oral, resp. rate 16, height 5\' 5"  (1.651 m), weight 215 lb 1.9 oz (97.6 kg), last menstrual period 07/17/2016.  Physical Exam  Nursing note and vitals reviewed. Constitutional: She is oriented to person, place, and time. She appears  well-developed and well-nourished. No distress.  HENT:  Head: Normocephalic.  Cardiovascular: Normal rate.   Respiratory: Effort normal.  GI: Soft. There is tenderness. There is no rebound.  Neurological: She is alert and oriented to person, place, and time.  Skin: Skin is warm and dry.  Psychiatric: She has a normal mood and affect.   Results for orders placed or performed during the hospital encounter of 08/25/16 (from the past 24 hour(s))  Urinalysis, Routine w reflex microscopic     Status: Abnormal   Collection Time: 08/25/16  9:14 AM  Result Value Ref Range   Color, Urine YELLOW YELLOW   APPearance CLOUDY (A) CLEAR   Specific Gravity, Urine 1.018 1.005 - 1.030   pH 5.0 5.0 - 8.0   Glucose, UA NEGATIVE NEGATIVE mg/dL   Hgb urine dipstick MODERATE (A) NEGATIVE   Bilirubin Urine NEGATIVE NEGATIVE   Ketones, ur NEGATIVE NEGATIVE mg/dL   Protein, ur NEGATIVE NEGATIVE mg/dL   Nitrite NEGATIVE NEGATIVE   Leukocytes, UA TRACE (A) NEGATIVE   RBC / HPF 6-30 0 - 5 RBC/hpf   WBC, UA 0-5 0 - 5 WBC/hpf   Bacteria, UA RARE (A) NONE SEEN   Squamous Epithelial / LPF 6-30 (A) NONE SEEN   Mucous PRESENT   Pregnancy, urine POC     Status: None   Collection Time: 08/25/16  9:23 AM  Result Value Ref Range   Preg Test, Ur NEGATIVE NEGATIVE   [place] Transvaginal Non-ob  Result Date: 08/25/2016 CLINICAL DATA:  Pelvic pain EXAM: TRANSABDOMINAL AND TRANSVAGINAL ULTRASOUND OF PELVIS TECHNIQUE: Both transabdominal and transvaginal  ultrasound examinations of the pelvis were performed. Transabdominal technique was performed for global imaging of the pelvis including uterus, ovaries, adnexal regions, and pelvic cul-de-sac. It was necessary to proceed with endovaginal exam following the transabdominal exam to visualize the uterus and endometrium. COMPARISON:  06/23/2015 FINDINGS: Uterus Measurements: 10.0 x 5.0 x 5.5 cm. Normal morphology without mass Endometrium Thickness: 6 mm thick. Fluid within endometrial  canal at the upper uterine segment. No focal mass. Right ovary Measurements: 5.1 x 3.5 x 4.1 cm. Complicated cyst containing debris/scattered internal echoes within RIGHT ovary 3.7 x 3.1 x 3.5 cm. No discrete mural nodule. Left ovary Measurements: 2.9 x 1.8 x 2.4 cm. Normal morphology without mass Other findings Small amount of nonspecific free pelvic fluid. No adnexal masses otherwise identified. IMPRESSION: Fluid within the endometrial canal at the upper uterine segment. Mildly complicated RIGHT ovarian cyst 3.7 cm greatest size, new. When compared to the previous exam, the previously identified pattern of numerous follicles ringing the ovaries is not identified on the current study. Electronically Signed   By: Ulyses Southward M.D.   On: 08/25/2016 11:12   US Pelvis Complete  Result Date: 08/25/2016 CLINICAL DATA:  Pelvic pain EXAM: TRANSABDOMINAL AND TRANSVAGINAL ULTRASOUND OF PELVIS TECHNIQUE: Both transabdominal and transvaginal ultrasound examinations of the pelvis were performed. Transabdominal technique was performed for global imaging of the pelvis including uterus, ovaries, adnexal regions, and pelvic cul-de-sac. It was necessary to proceed with endovaginal exam following the transabdominal exam to visualize the uterus and endometrium. COMPARISON:  06/23/2015 FINDINGS: Uterus Measurements: 10.0 x 5.0 x 5.5 cm. Normal morphology without mass Endometrium Thickness: 6 mm thick. Fluid within endometrial canal at the upper uterine segment. No focal mass. Right ovary Measurements: 5.1 x 3.5 x 4.1 cm. Complicated cyst containing debris/scattered internal echoes within RIGHT ovary 3.7 x 3.1 x 3.5 cm. No discrete mural nodule. Left ovary Measurements: 2.9 x 1.8 x 2.4 cm. Normal morphology without mass Other findings Small amount of nonspecific free pelvic fluid. No adnexal masses otherwise identified. IMPRESSION: Fluid within the endometrial canal at the upper uterine segment. Mildly complicated RIGHT ovarian cyst  3.7 cm greatest size, new. When compared to the previous exam, the previously identified pattern of numerous follicles ringing the ovaries is not identified on the current study. Electronically Signed   By: Ulyses Southward M.D.   On: 08/25/2016 11:12   MAU Course  Procedures  MDM  Patient refusing wet prep/GC today. She states that she had all that done "2 days ago". No record in epic.   Assessment and Plan   1. Cyst of right ovary   2. Pelvic pain    DC home Comfort measures reviewed  RX: ibuprofen 800mg  q 8 hours PRN  Return to MAU as needed   Follow-up Information    [facility] Follow up.   Specialty:  Obstetrics and Gynecology Why:  They will call you with an appointment  Contact information: [address] [place] [address] [phone]       [facility] [place] [facility] Follow up.   Specialty:  Radiology Why:  They will call you for repeat ultrasound  Contact information: [address] [id number] mc [place] [address] [phone]           Thressa Sheller 08/25/2016, 9:51 AM

## 2016-08-25 NOTE — MAU Note (Signed)
Patient presents with abdominal pain since Thursday, denies vaginal discharge or bleeding, missed her period this month.

## 2016-09-01 ENCOUNTER — Encounter: Payer: Self-pay | Admitting: Obstetrics and Gynecology

## 2016-10-17 IMAGING — US US PELVIS COMPLETE
1 series · 13 of 25 positions shown · non-contrast
Comparison: None

CLINICAL DATA: RIGHT adnexal pain on pelvic exam today, vaginal
pain since intercourse 2 days ago, some bleeding, initial encounter

EXAM:
TRANSABDOMINAL AND TRANSVAGINAL ULTRASOUND OF PELVIS
TECHNIQUE: Both transabdominal and transvaginal ultrasound examinations of the
pelvis were performed. Transabdominal technique was performed for
global imaging of the pelvis including uterus, ovaries, adnexal
regions, and pelvic cul-de-sac. It was necessary to proceed with
endovaginal exam following the transabdominal exam to visualize the
endometrium and RIGHT ovary.

[Series 1: us pelvis complete · 0.21mm/px · 13 of 49 slices shown]
[im 1/49]
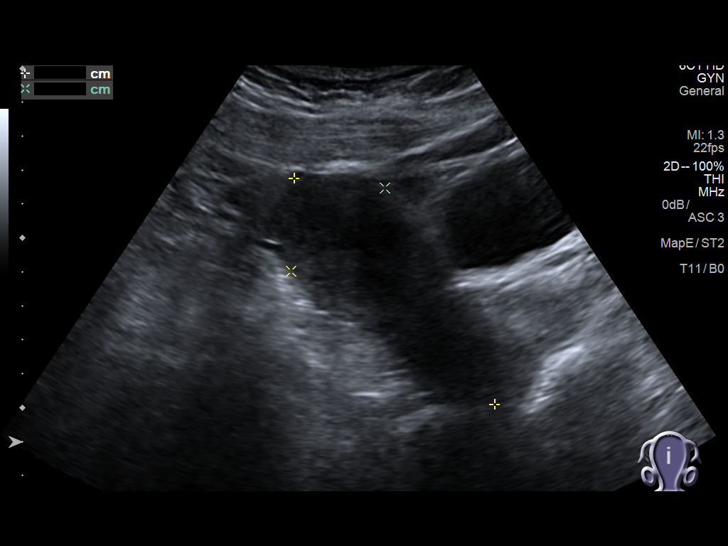
[im 5/49]
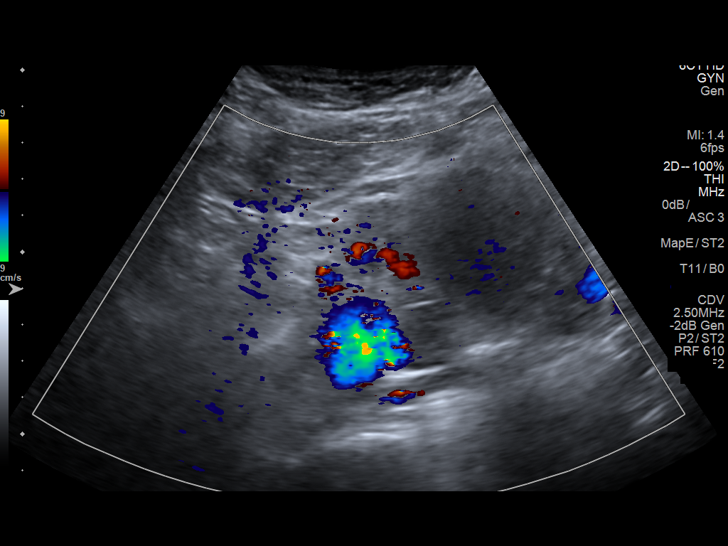
[im 9/49]
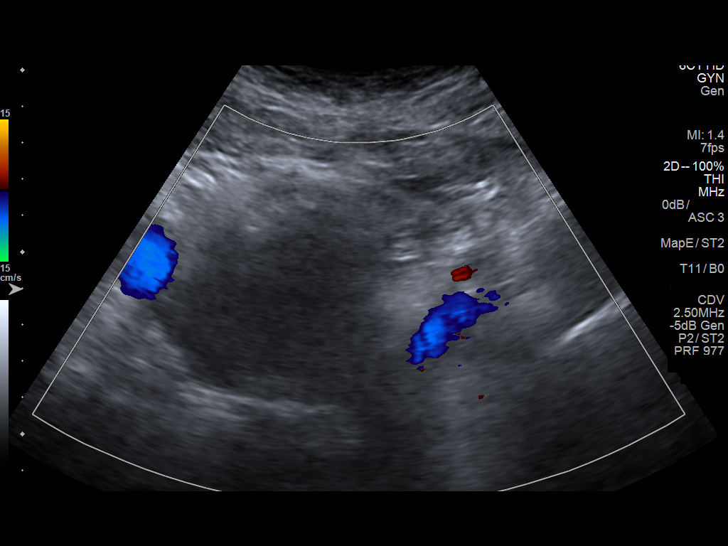
[im 13/49]
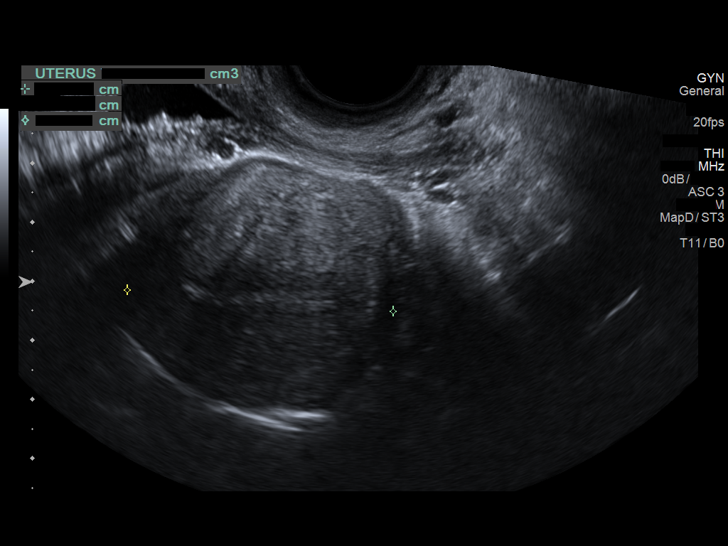
[im 17/49]
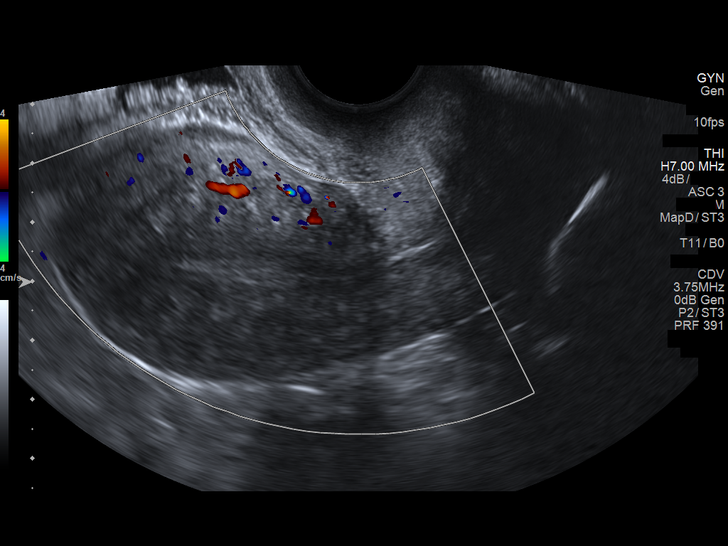
[im 21/49]
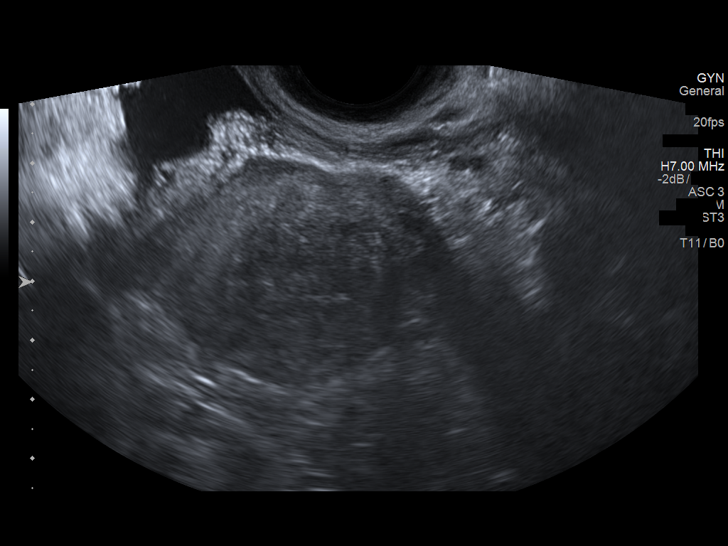
[im 25/49]
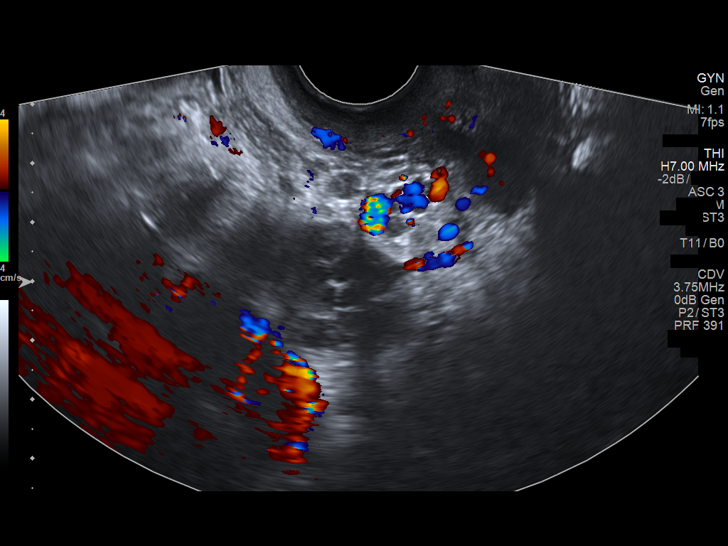
[im 29/49]
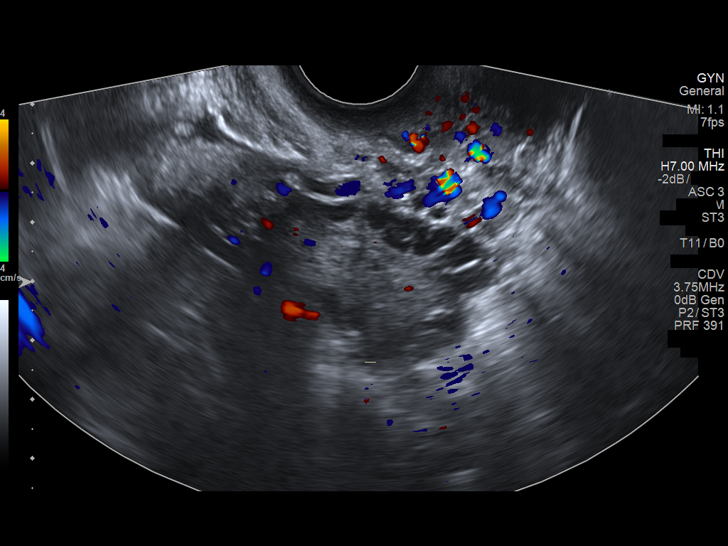
[im 33/49]
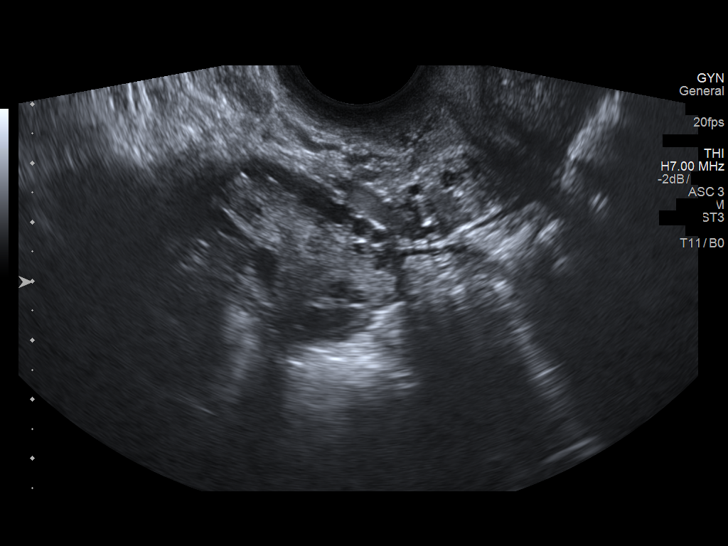
[im 37/49]
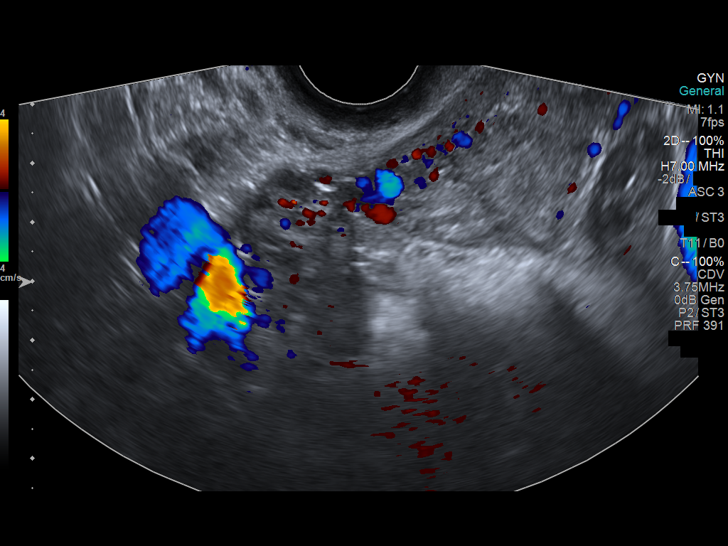
[im 41/49]
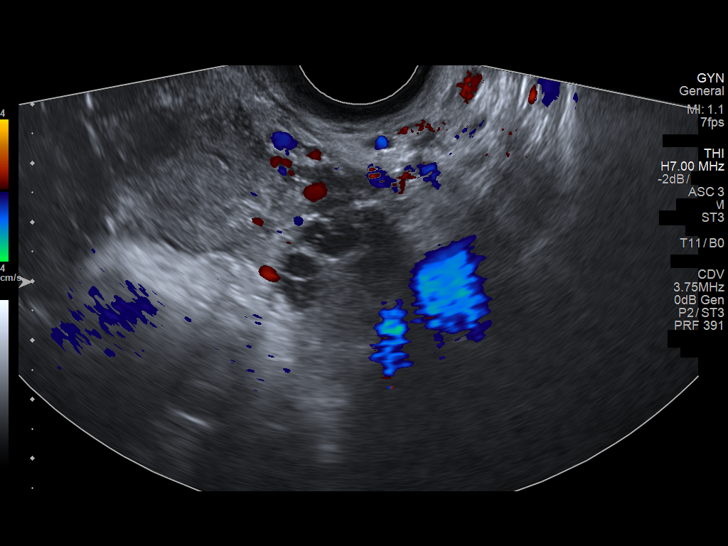
[im 45/49]
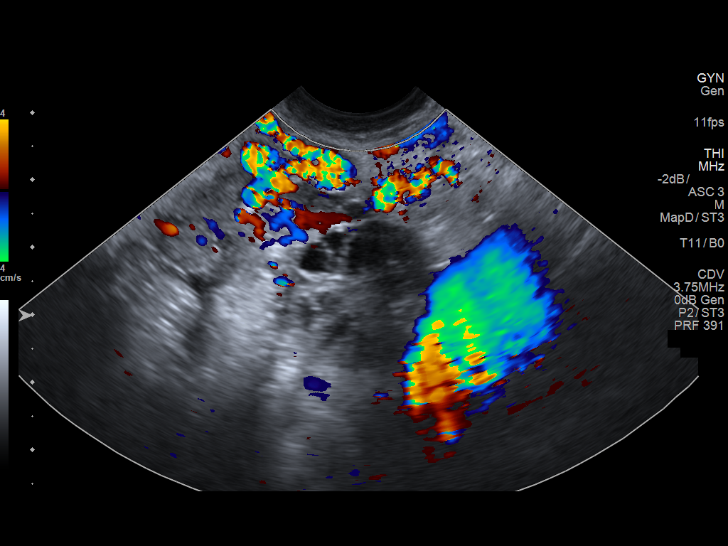
[im 49/49]
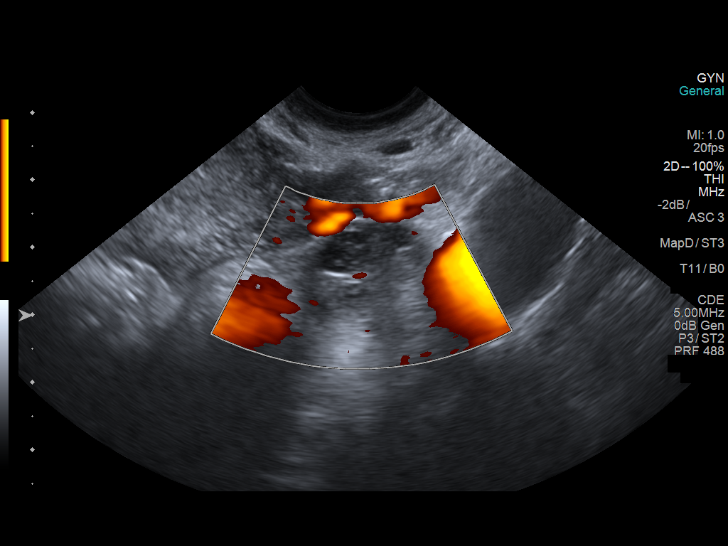

[13 of 25 positions shown; findings below may reference images not displayed]

FINDINGS: Uterus

Measurements: 8.7 x 4.1 x 4.5 cm. Heterogeneous myometrial
echogenicity without focal mass.

Endometrium

Thickness: 5 mm thick, normal. No endometrial fluid or focal
abnormality

Right ovary

Measurements: 4.1 x 2.5 x 2.7 cm. Numerous peripheral follicles,
nonspecific but has been described in patients with polycystic ovary
disease. No dominant RIGHT ovarian mass.

Left ovary

Measurements: 3.4 x 2.2 x 2.3 cm. Numerous peripheral follicles
though less prominent than on RIGHT. No dominant mass.

Other findings

No free fluid or adnexal masses.
IMPRESSION: Unremarkable uterus and endometrium.

Numerous peripheral follicles in the ovaries, slightly more
prominent on RIGHT, nonspecific but has been described in patients
with polycystic ovarian disease.

## 2016-12-03 ENCOUNTER — Other Ambulatory Visit: Payer: Self-pay

## 2016-12-03 ENCOUNTER — Inpatient Hospital Stay (HOSPITAL_COMMUNITY)
Admission: AD | Admit: 2016-12-03 | Discharge: 2016-12-03 | Disposition: A | Discharge status: Home / Self Care | Payer: Self-pay | Source: Ambulatory Visit | Attending: Obstetrics and Gynecology | Admitting: Obstetrics and Gynecology

## 2016-12-03 ENCOUNTER — Encounter (HOSPITAL_COMMUNITY): Payer: Self-pay | Admitting: *Deleted

## 2016-12-03 DIAGNOSIS — N83291 Other ovarian cyst, right side: Secondary | ICD-10-CM | POA: Insufficient documentation

## 2016-12-03 DIAGNOSIS — Z87891 Personal history of nicotine dependence: Secondary | ICD-10-CM | POA: Insufficient documentation

## 2016-12-03 DIAGNOSIS — Z833 Family history of diabetes mellitus: Secondary | ICD-10-CM | POA: Insufficient documentation

## 2016-12-03 DIAGNOSIS — Z9851 Tubal ligation status: Secondary | ICD-10-CM | POA: Insufficient documentation

## 2016-12-03 DIAGNOSIS — N760 Acute vaginitis: Secondary | ICD-10-CM | POA: Insufficient documentation

## 2016-12-03 DIAGNOSIS — B9689 Other specified bacterial agents as the cause of diseases classified elsewhere: Secondary | ICD-10-CM | POA: Insufficient documentation

## 2016-12-03 DIAGNOSIS — R102 Pelvic and perineal pain: Secondary | ICD-10-CM

## 2016-12-03 HISTORY — DX: Unspecified ovarian cyst, unspecified side: N83.209

## 2016-12-03 LAB — WET PREP, GENITAL
Sperm: NONE SEEN
Trich, Wet Prep: NONE SEEN
Yeast Wet Prep HPF POC: NONE SEEN

## 2016-12-03 LAB — URINALYSIS, ROUTINE W REFLEX MICROSCOPIC
Bilirubin Urine: NEGATIVE
Glucose, UA: NEGATIVE mg/dL
Ketones, ur: NEGATIVE mg/dL
Leukocytes, UA: NEGATIVE
Nitrite: NEGATIVE
PROTEIN: NEGATIVE mg/dL
SPECIFIC GRAVITY, URINE: 1.021 (ref 1.005–1.030)
WBC, UA: NONE SEEN WBC/hpf (ref 0–5)
pH: 6 (ref 5.0–8.0)

## 2016-12-03 LAB — POCT PREGNANCY, URINE: PREG TEST UR: NEGATIVE

## 2016-12-03 MED ORDER — METRONIDAZOLE 500 MG PO TABS: 500.0000 mg | ORAL_TABLET | Freq: Two times a day (BID) | ORAL | 0 refills | Status: DC

## 2016-12-03 MED ORDER — IBUPROFEN 800 MG PO TABS: 800.0000 mg | ORAL_TABLET | Freq: Three times a day (TID) | ORAL | 0 refills | Status: AC | PRN

## 2016-12-03 MED ORDER — KETOROLAC TROMETHAMINE 30 MG/ML IJ SOLN
60.0000 mg | Freq: Once | INTRAMUSCULAR | Status: AC
Start: 2016-12-03 — End: 2016-12-03
  Administered 2016-12-03: 60 mg via INTRAMUSCULAR
  Filled 2016-12-03: qty 2

## 2016-12-03 NOTE — Discharge Instructions (Signed)
Pelvic Pain, Female Pelvic pain is pain in your lower belly (abdomen), below your belly button and between your hips. The pain may start suddenly (acute), keep coming back (recurring), or last a long time (chronic). Pelvic pain that lasts longer than six months is considered chronic. There are many causes of pelvic pain. Sometimes the cause of your pelvic pain is not known. Follow these instructions at home:  Take over-the-counter and prescription medicines only as told by your doctor.  Rest as told by your doctor.  Do not have sex it if hurts.  Keep a journal of your pelvic pain. Write down: ? When the pain started. ? Where the pain is located. ? What seems to make the pain better or worse, such as food or your menstrual cycle. ? Any symptoms you have along with the pain.  Keep all follow-up visits as told by your doctor. This is important. Contact a doctor if:  Medicine does not help your pain.  Your pain comes back.  You have new symptoms.  You have unusual vaginal discharge or bleeding.  You have a fever or chills.  You are having a hard time pooping (constipation).  You have blood in your pee (urine) or poop (stool).  Your pee smells bad.  You feel weak or lightheaded. Get help right away if:  You have sudden pain that is very bad.  Your pain continues to get worse.  You have very bad pain and also have any of the following symptoms: ? A fever. ? Feeling stick to your stomach (nausea). ? Throwing up (vomiting). ? Being very sweaty.  You pass out (lose consciousness). This information is not intended to replace advice given to you by your health care provider. Make sure you discuss any questions you have with your health care provider. Document Released: 06/21/2007 Document Revised: 01/27/2015 Document Reviewed: 10/23/2014 Elsevier Interactive Patient Education  2018 Elsevier Inc. Bacterial Vaginosis Bacterial vaginosis is an infection of the vagina. It  happens when too many germs (bacteria) grow in the vagina. This infection puts you at risk for infections from sex (STIs). Treating this infection can lower your risk for some STIs. You should also treat this if you are pregnant. It can cause your baby to be born early. Follow these instructions at home: Medicines  Take over-the-counter and prescription medicines only as told by your doctor.  Take or use your antibiotic medicine as told by your doctor. Do not stop taking or using it even if you start to feel better. General instructions  If you your sexual partner is a woman, tell her that you have this infection. She needs to get treatment if she has symptoms. If you have a female partner, he does not need to be treated.  During treatment: ? Avoid sex. ? Do not douche. ? Avoid alcohol as told. ? Avoid breastfeeding as told.  Drink enough fluid to keep your pee (urine) clear or pale yellow.  Keep your vagina and butt (rectum) clean. ? Wash the area with warm water every day. ? Wipe from front to back after you use the toilet.  Keep all follow-up visits as told by your doctor. This is important. Preventing this condition  Do not douche.  Use only warm water to wash around your vagina.  Use protection when you have sex. This includes: ? Latex condoms. ? Dental dams.  Limit how many people you have sex with. It is best to only have sex with the same person (be  monogamous).  Get tested for STIs. Have your partner get tested.  Wear underwear that is cotton or lined with cotton.  Avoid tight pants and pantyhose. This is most important in summer.  Do not use any products that have nicotine or tobacco in them. These include cigarettes and e-cigarettes. If you need help quitting, ask your doctor.  Do not use illegal drugs.  Limit how much alcohol you drink. Contact a doctor if:  Your symptoms do not get better, even after you are treated.  You have more discharge or pain when  you pee (urinate).  You have a fever.  You have pain in your belly (abdomen).  You have pain with sex.  Your bleed from your vagina between periods. Summary  This infection happens when too many germs (bacteria) grow in the vagina.  Treating this condition can lower your risk for some infections from sex (STIs).  You should also treat this if you are pregnant. It can cause early (premature) birth.  Do not stop taking or using your antibiotic medicine even if you start to feel better. This information is not intended to replace advice given to you by your health care provider. Make sure you discuss any questions you have with your health care provider. Document Released: 10/12/2007 Document Revised: 09/18/2015 Document Reviewed: 09/18/2015 Elsevier Interactive Patient Education  2017 [employer]  In late 2019, [facility] will be [facility]. At that time, the MAU (Maternity Admissions Unit), where you are being seen today, will no longer see non-pregnant patients. We strongly encourage you to find a doctor's office before that time, so that you can be seen with any GYN concerns, like vaginal discharge, urinary tract infection, etc.. in a timely manner.   In order to make the office visit more convenient, the [facility] at [facility] will be offering evening hours from 4pm-7:30pm on Monday. There will be same-day appointments, walk-in appointments and scheduled appointments available during this time. We will be adding more evening hours over the next year before the move.   [facility] [phone]- [phone]  For urgent needs, [facility] is also available for management of urgent GYN complaints such as vaginal discharge or urinary tract infections.

## 2016-12-03 NOTE — MAU Provider Note (Signed)
History     CSN: [id number]  Arrival date and time: 12/03/16 1317   First Provider Initiated Contact with Patient 12/03/16 1402      Chief Complaint  Patient presents with  . Vaginal Bleeding  . Abdominal Pain   31 y.o. non-pregnant female here with pelvic pain. Pain started 2 days ago. She describes as constant and primarily on left side. She has not used anything for it. No aggravating or alleviating factors. Denies fevers. Associated sx are spotting. No vaginal discharge. No new sexual partner. Hx of Chlamydia and Gonorrhea. She was seen 3 months ago in MAU and found to have a 3.7cm complicated right ovarian cyst. [facility] but missed her appointment.    Past Medical History:  Diagnosis Date  . Ovarian cyst     Past Surgical History:  Procedure Laterality Date  . CESAREAN SECTION    . TUBAL LIGATION      Family History  Problem Relation Age of Onset  . Diabetes Father   . Cancer Maternal Grandmother     Social History   Tobacco Use  . Smoking status: Former Smoker    Types: Cigars  . Smokeless tobacco: Never Used  Substance Use Topics  . Alcohol use: No  . Drug use: Yes    Frequency: 7.0 times per week    Types: Marijuana, Cocaine    Allergies: No Known Allergies  No medications prior to admission.    Review of Systems  Constitutional: Negative.   Gastrointestinal: Positive for nausea.  Genitourinary: Positive for pelvic pain and vaginal bleeding. Negative for vaginal discharge.   Physical Exam   Blood pressure 119/78, pulse 93, temperature 98.3 F (36.8 C), temperature source Oral, resp. rate 16, weight 209 lb 4 oz (94.9 kg), last menstrual period 11/10/2016, SpO2 100 %.  Physical Exam  Constitutional: She is oriented to person, place, and time. She appears well-developed and well-nourished. No distress (appears comfortable).  HENT:  Head: Normocephalic.  Neck: Normal range of motion.  Respiratory: Effort normal. No respiratory  distress.  GI: Soft. She exhibits no distension and no mass. There is no tenderness. There is no rebound and no guarding.  Genitourinary:  Genitourinary Comments: External: no lesions or erythema Vagina: rugated, pink, moist, scant thin white discharge Uterus: non enlarged, anteverted, non tender, no CMT Adnexae: no masses, + tenderness left, no tenderness right   Musculoskeletal: Normal range of motion.  Neurological: She is alert and oriented to person, place, and time.  Skin: Skin is warm and dry.  Psychiatric: She has a normal mood and affect.   Results for orders placed or performed during the hospital encounter of 12/03/16 (from the past 24 hour(s))  Urinalysis, Routine w reflex microscopic     Status: Abnormal   Collection Time: 12/03/16  1:33 PM  Result Value Ref Range   Color, Urine YELLOW YELLOW   APPearance CLOUDY (A) CLEAR   Specific Gravity, Urine 1.021 1.005 - 1.030   pH 6.0 5.0 - 8.0   Glucose, UA NEGATIVE NEGATIVE mg/dL   Hgb urine dipstick MODERATE (A) NEGATIVE   Bilirubin Urine NEGATIVE NEGATIVE   Ketones, ur NEGATIVE NEGATIVE mg/dL   Protein, ur NEGATIVE NEGATIVE mg/dL   Nitrite NEGATIVE NEGATIVE   Leukocytes, UA NEGATIVE NEGATIVE   RBC / HPF 6-30 0 - 5 RBC/hpf   WBC, UA NONE SEEN 0 - 5 WBC/hpf   Bacteria, UA RARE (A) NONE SEEN   Squamous Epithelial / LPF 6-30 (A) NONE  SEEN   Mucus PRESENT   Pregnancy, urine POC     Status: None   Collection Time: 12/03/16  1:54 PM  Result Value Ref Range   Preg Test, Ur NEGATIVE NEGATIVE  Wet prep, genital     Status: Abnormal   Collection Time: 12/03/16  2:09 PM  Result Value Ref Range   Yeast Wet Prep HPF POC NONE SEEN NONE SEEN   Trich, Wet Prep NONE SEEN NONE SEEN   Clue Cells Wet Prep HPF POC PRESENT (A) NONE SEEN   WBC, Wet Prep HPF POC FEW (A) NONE SEEN   Sperm NONE SEEN     MAU Course  Procedures Toradol  MDM Labs ordered and reviewed. No evidence of acute pelvic process. Pain improved after med. Will  treat BV. Stable for discharge home.  Assessment and Plan   1. Pelvic pain   2. Bacterial vaginosis    Discharge home Follow up in [facility]- pt to call and scheduled appt Return to MAU for OBGYN emergencies Rx Ibuprofen  Allergies as of 12/03/2016   No Known Allergies     Medication List    TAKE these medications   ibuprofen 800 MG tablet Commonly known as:  ADVIL,MOTRIN Take 1 tablet (800 mg total) every 8 (eight) hours as needed by mouth for moderate pain. What changed:  reasons to take this   metroNIDAZOLE 500 MG tablet Commonly known as:  FLAGYL Take 1 tablet (500 mg total) 2 (two) times daily by mouth.      Donette LarryMelanie Spring [place], CNM 12/03/2016, 3:24 PM

## 2016-12-03 NOTE — MAU Note (Signed)
Knows she has a cyst on her ovary, hurts from time to time, today it was just unbearable. Had some spotting this morning, not time for her cycle

## 2016-12-04 LAB — GC/CHLAMYDIA PROBE AMP (~~LOC~~) NOT AT ARMC
CHLAMYDIA, DNA PROBE: NEGATIVE
Neisseria Gonorrhea: NEGATIVE

## 2019-03-17 ENCOUNTER — Emergency Department (HOSPITAL_COMMUNITY)
Admission: EM | Admit: 2019-03-17 | Discharge: 2019-03-17 | Disposition: A | Discharge status: Home / Self Care | Payer: Medicaid Other | Attending: Emergency Medicine | Admitting: Emergency Medicine

## 2019-03-17 DIAGNOSIS — Z79899 Other long term (current) drug therapy: Secondary | ICD-10-CM | POA: Insufficient documentation

## 2019-03-17 DIAGNOSIS — R220 Localized swelling, mass and lump, head: Secondary | ICD-10-CM | POA: Diagnosis present

## 2019-03-17 DIAGNOSIS — Z87891 Personal history of nicotine dependence: Secondary | ICD-10-CM | POA: Insufficient documentation

## 2019-03-17 DIAGNOSIS — K130 Diseases of lips: Secondary | ICD-10-CM | POA: Diagnosis not present

## 2019-03-17 MED ORDER — BUPIVACAINE-EPINEPHRINE (PF) 0.5% -1:200000 IJ SOLN
1.8000 mL | Freq: Once | INTRAMUSCULAR | Status: AC
  Administered 2019-03-17: 1.8 mL
  Filled 2019-03-17: qty 1.8

## 2019-03-17 MED ORDER — LIDOCAINE VISCOUS HCL 2 % MT SOLN: 15.0000 mL | OROMUCOSAL | 2 refills | Status: AC | PRN

## 2019-03-17 MED ORDER — AMOXICILLIN-POT CLAVULANATE 875-125 MG PO TABS: 1.0000 | ORAL_TABLET | Freq: Two times a day (BID) | ORAL | 0 refills | Status: DC

## 2019-03-17 NOTE — ED Triage Notes (Signed)
Pt arrives pov with reports of swelling to her lip at the sight of her piercing. Reports this happened with her last lip piercing and they had to numb her up and cut her lip to remove the piercing.

## 2019-03-17 NOTE — ED Provider Notes (Signed)
[facility] EMERGENCY DEPARTMENT Provider Note   CSN: [id number] Arrival date & time: 03/17/19  2248     History No chief complaint on file.   Taylor Butler is a 34 y.o. female.  HPI     Taylor Butler is a 34 y.o. female, with a history of lip piercing, presenting to the ED with left lower lip swelling and pain for the last 2 days. Pain is throbbing, moderate to severe, nonradiating. She states she has a lip piercing that was performed about a year ago. At some point, the back of the piercing became covered by skin. She states this happened 1 time before and she had to have the piercing cut out in the emergency department.  After this occurred, she had her lip repierced.  Denies fever/chills, nausea/vomiting, difficulty breathing, or any other complaints.   Past Medical History:  Diagnosis Date  . Ovarian cyst     There are no problems to display for this patient.   Past Surgical History:  Procedure Laterality Date  . CESAREAN SECTION    . TUBAL LIGATION       OB History    Gravida  7   Para  5   Term  5   Preterm      AB  2   Living  5     SAB  2   TAB      Ectopic      Multiple      Live Births  5           Family History  Problem Relation Age of Onset  . Diabetes Father   . Cancer Maternal Grandmother     Social History   Tobacco Use  . Smoking status: Former Smoker    Types: Cigars  . Smokeless tobacco: Never Used  Substance Use Topics  . Alcohol use: No  . Drug use: Yes    Frequency: 7.0 times per week    Types: Marijuana, Cocaine    Home Medications Prior to Admission medications   Medication Sig Start Date End Date Taking? Authorizing Provider  amoxicillin-clavulanate (AUGMENTIN) 875-125 MG tablet Take 1 tablet by mouth every 12 (twelve) hours. 03/17/19   Maddoxx Burkitt C, PA-C  ibuprofen (ADVIL,MOTRIN) 800 MG tablet Take 1 tablet (800 mg total) every 8 (eight) hours as needed by mouth for moderate pain.  12/03/16   Donette Larry, CNM  lidocaine (XYLOCAINE) 2 % solution Use as directed 15 mLs in the mouth or throat as needed for mouth pain. 03/17/19   Egidio Lofgren C, PA-C  metroNIDAZOLE (FLAGYL) 500 MG tablet Take 1 tablet (500 mg total) 2 (two) times daily by mouth. 12/03/16   Donette Larry, CNM    Allergies    Patient has no known allergies.  Review of Systems   Review of Systems  Constitutional: Negative for chills and fever.  HENT: Positive for facial swelling. Negative for sore throat and trouble swallowing.   Respiratory: Negative for shortness of breath.   Gastrointestinal: Negative for nausea and vomiting.  Musculoskeletal: Negative for neck pain and neck stiffness.  All other systems reviewed and are negative.   Physical Exam Updated Vital Signs BP 111/86   Pulse 78   Temp 98.9 F (37.2 C) (Oral)   Resp 16   SpO2 94%   Physical Exam Vitals and nursing note reviewed.  Constitutional:      General: She is not in acute distress.    Appearance: She  is well-developed. She is not diaphoretic.  HENT:     Head: Normocephalic and atraumatic.     Mouth/Throat:     Mouth: Mucous membranes are moist.     Comments: Swelling to the lower lip, worse on the left.  Associated tenderness.  There is a ball shaped piercing present on the outside of the lip, however, I am unable to see the backing of the piercing inside the mouth.  Dentition appears to be intact and stable.  No noted area of intraoral swelling or fluctuance other than the lower lip.  No trismus or noted abnormal phonation.  Mouth opening to at least 3 finger widths.  Handles oral secretions without difficulty.  No sublingual swelling.  No swelling or tenderness to the submental or submandibular regions.  No swelling or tenderness into the soft tissues of the neck. Eyes:     Conjunctiva/sclera: Conjunctivae normal.  Cardiovascular:     Rate and Rhythm: Normal rate and regular rhythm.     Pulses: Normal pulses.           Radial pulses are 2+ on the right side and 2+ on the left side.     Comments: Tactile temperature in the extremities appropriate and equal bilaterally. Pulmonary:     Effort: Pulmonary effort is normal. No respiratory distress.  Abdominal:     Tenderness: There is no guarding.  Musculoskeletal:     Cervical back: Normal range of motion and neck supple.  Lymphadenopathy:     Cervical: No cervical adenopathy.  Skin:    General: Skin is warm and dry.  Neurological:     Mental Status: She is alert.  Psychiatric:        Mood and Affect: Mood and affect normal.        Speech: Speech normal.        Behavior: Behavior normal.     ED Results / Procedures / Treatments   Labs (all labs ordered are listed, but only abnormal results are displayed) Labs Reviewed - No data to display  EKG None  Radiology No results found.  Procedures .Nerve Block  Date/Time: 03/17/2019 12:30 PM Performed by: Lorayne Bender, PA-C Authorized by: Lorayne Bender, PA-C   Consent:    Consent obtained:  Verbal   Consent given by:  Patient   Risks discussed:  Infection, swelling, unsuccessful block and pain Indications:    Indications:  Procedural anesthesia Location:    Body area:  Head   Head nerve blocked: Mental nerve block.   Laterality:  Left Procedure details (see MAR for exact dosages):    Block needle gauge:  25 G   Anesthetic injected:  Bupivacaine 0.5% WITH epi   Injection procedure:  Anatomic landmarks identified, anatomic landmarks palpated, incremental injection, introduced needle and negative aspiration for blood Post-procedure details:    Outcome:  Anesthesia achieved   Patient tolerance of procedure:  Tolerated well, no immediate complications .Marland KitchenIncision and Drainage  Date/Time: 03/17/2019 1:00 PM Performed by: Lorayne Bender, PA-C Authorized by: Lorayne Bender, PA-C   Consent:    Consent obtained:  Verbal   Consent given by:  Patient   Risks discussed:  Bleeding, incomplete drainage,  pain, infection and damage to other organs Location:    Type:  Abscess   Location:  Mouth   Mouth location: lower lip. Anesthesia (see MAR for exact dosages):    Anesthesia method:  Nerve block   Block location:  Left mental nerve block   Block  needle gauge:  25 G   Block injection procedure:  Anatomic landmarks identified, anatomic landmarks palpated, introduced needle, negative aspiration for blood and incremental injection   Block outcome:  Anesthesia achieved Procedure type:    Complexity:  Complex Procedure details:    Incision types:  Single straight   Incision depth:  Submucosal   Scalpel blade:  15   Wound management:  Irrigated with saline and extensive cleaning   Drainage:  Purulent   Drainage amount:  Moderate   Wound treatment:  Wound left open   Packing materials:  None Post-procedure details:    Patient tolerance of procedure:  Tolerated well, no immediate complications .Foreign Body Removal  Date/Time: 03/17/2019 1:45 PM Performed by: Anselm Pancoast, PA-C Authorized by: Anselm Pancoast, PA-C  Consent: Verbal consent obtained. Consent given by: patient Patient understanding: patient states understanding of the procedure being performed Patient consent: the patient's understanding of the procedure matches consent given Procedure consent: procedure consent matches procedure scheduled Patient identity confirmed: verbally with patient and provided demographic data Body area: mucosa General location: head/neck Location details: mouth Anesthesia: nerve block  Anesthesia: Local Anesthetic: bupivacaine 0.5% with epinephrine Anesthetic total: 1 mL  Sedation: Patient sedated: no  Patient restrained: no Patient cooperative: yes Localization method: visualized Removal mechanism: hemostat and scalpel Depth: subcutaneous Complexity: complex 1 objects recovered. Objects recovered: Lip piercing backing Post-procedure assessment: foreign body removed Patient tolerance:  patient tolerated the procedure well with no immediate complications   (including critical care time)  Medications Ordered in ED Medications  bupivacaine-epinephrine (MARCAINE W/ EPI) 0.5% -1:200000 injection 1.8 mL (1.8 mLs Infiltration Given by Other 03/17/19 1318)    ED Course  I have reviewed the triage vital signs and the nursing notes.  Pertinent labs & imaging results that were available during my care of the patient were reviewed by me and considered in my medical decision making (see chart for details).    MDM Rules/Calculators/A&P                      Patient presents with painful lip swelling. Patient is nontoxic appearing, afebrile, not tachycardic, not tachypneic, not hypotensive, maintains excellent SPO2 on room air, and is in no apparent distress.  I have reviewed the patient's chart. I&D was performed and foreign body was removed successfully.  Antibiotic therapy initiated. The patient was given instructions for home care as well as return precautions. Patient voices understanding of these instructions, accepts the plan, and is comfortable with discharge.   Findings and plan of care discussed with Pricilla Loveless, MD.    Vitals:   03/17/19 1000 03/17/19 1001 03/17/19 1420  BP: (!) 120/92 111/86 121/71  Pulse:  78 71  Resp:  16 16  Temp:  98.9 F (37.2 C)   TempSrc:  Oral   SpO2: 95% 94% 95%     Final Clinical Impression(s) / ED Diagnoses Final diagnoses:  Lip abscess    Rx / DC Orders ED Discharge Orders         Ordered    amoxicillin-clavulanate (AUGMENTIN) 875-125 MG tablet  Every 12 hours     03/17/19 1415    lidocaine (XYLOCAINE) 2 % solution  As needed     03/17/19 1416           Anselm Pancoast, PA-C 03/19/19 1346    Pricilla Loveless, MD 03/22/19 [phone]

## 2019-03-17 NOTE — Discharge Instructions (Addendum)
Please avoid any further mouth piercings. Lidocaine liquid: Use the viscous lidocaine for mouth pain. Swish with the lidocaine and spit it out. Do not swallow it. Salt water solution: You should also swish with a homemade salt water solution, twice a day.  Make this solution by mixing 8 ounces of warm water with about half a teaspoon of salt. Antiinflammatory medications: Take 600 mg of ibuprofen every 6 hours or 440 mg (over the counter dose) to 500 mg (prescription dose) of naproxen every 12 hours for the next 3 days. After this time, these medications may be used as needed for pain. Take these medications with food to avoid upset stomach. Choose only one of these medications, do not take them together. Acetaminophen (generic for Tylenol): Should you continue to have additional pain while taking the ibuprofen or naproxen, you may add in acetaminophen as needed. Your daily total maximum amount of acetaminophen from all sources should be limited to 4000mg /day for persons without liver problems, or 2000mg /day for those with liver problems.  Please take all of your antibiotics until finished!   You may develop abdominal discomfort or diarrhea from the antibiotic.  You may help offset this with probiotics which you can buy or get in yogurt. Do not eat or take the probiotics until 2 hours after your antibiotic.   Follow-up: Follow-up with the ear, nose, throat specialist for persistent symptoms.  Return: Return to the emergency department for worsening swelling, worsening pain, fever connected with the swelling, or any other major concerns.  For prescription assistance, may try using prescription discount sites or apps, such as goodrx.com

## 2019-04-14 ENCOUNTER — Other Ambulatory Visit: Payer: Self-pay

## 2019-04-14 ENCOUNTER — Emergency Department (HOSPITAL_COMMUNITY)
Admission: EM | Admit: 2019-04-14 | Discharge: 2019-04-14 | Disposition: A | Discharge status: Home / Self Care | Payer: Medicaid Other | Attending: Emergency Medicine | Admitting: Emergency Medicine

## 2019-04-14 ENCOUNTER — Encounter (HOSPITAL_COMMUNITY): Payer: Self-pay | Admitting: Emergency Medicine

## 2019-04-14 DIAGNOSIS — Z20822 Contact with and (suspected) exposure to covid-19: Secondary | ICD-10-CM | POA: Insufficient documentation

## 2019-04-14 DIAGNOSIS — Z87891 Personal history of nicotine dependence: Secondary | ICD-10-CM | POA: Diagnosis not present

## 2019-04-14 DIAGNOSIS — R05 Cough: Secondary | ICD-10-CM | POA: Insufficient documentation

## 2019-04-14 DIAGNOSIS — R0602 Shortness of breath: Secondary | ICD-10-CM | POA: Diagnosis not present

## 2019-04-14 NOTE — ED Triage Notes (Signed)
Patient is requesting Covid test as she was around friend x2 days ago that was positive. Reports she developed cough, SOB, and headache yesterday.

## 2019-04-14 NOTE — ED Provider Notes (Signed)
Taylor Butler   CSN: [id number] Arrival date & time: 04/14/19  1955     History Chief Complaint  Patient presents with  . Covid Exposure    Taylor Butler is a 34 y.o. female presenting with request for Covid test.  Patient states 2 days ago she spent 8 hours with a friend, and she found out today that her friend just tested positive.  Patient states yesterday she developed a mild headache.  She has had a mild cough and intermittent shortness of breath.  She is not feeling short of breath right now.  She denies fevers, chills, sore throat, chest pain, nausea, vomiting, domino pain.  She has no medical problems, takes medications daily.  She has not taken anything for her symptoms.  She denies history of asthma or COPD.  She does not smoke cigarettes.  She states she is just here for Covid test.  HPI     Past Medical History:  Diagnosis Date  . Ovarian cyst     There are no problems to display for this patient.   Past Surgical History:  Procedure Laterality Date  . CESAREAN SECTION    . TUBAL LIGATION       OB History    Gravida  7   Para  5   Term  5   Preterm      AB  2   Living  5     SAB  2   TAB      Ectopic      Multiple      Live Births  5           Family History  Problem Relation Age of Onset  . Diabetes Father   . Cancer Maternal Grandmother     Social History   Tobacco Use  . Smoking status: Former Smoker    Types: Cigars  . Smokeless tobacco: Never Used  Substance Use Topics  . Alcohol use: No  . Drug use: Yes    Frequency: 7.0 times per week    Types: Marijuana, Cocaine    Home Medications Prior to Admission medications   Medication Sig Start Date End Date Taking? Authorizing Provider  amoxicillin-clavulanate (AUGMENTIN) 875-125 MG tablet Take 1 tablet by mouth every 12 (twelve) hours. 03/17/19   Joy, Shawn C, PA-C  ibuprofen (ADVIL,MOTRIN) 800 MG tablet Take 1 tablet (800  mg total) every 8 (eight) hours as needed by mouth for moderate pain. 12/03/16   Julianne Handler, CNM  lidocaine (XYLOCAINE) 2 % solution Use as directed 15 mLs in the mouth or throat as needed for mouth pain. 03/17/19   Joy, Shawn C, PA-C  metroNIDAZOLE (FLAGYL) 500 MG tablet Take 1 tablet (500 mg total) 2 (two) times daily by mouth. 12/03/16   Julianne Handler, CNM    Allergies    Patient has no known allergies.  Review of Systems   Review of Systems  Respiratory: Positive for cough and shortness of breath (Intermittent, none currently).   Neurological: Positive for headaches (Mild).    Physical Exam Updated Vital Signs BP 133/88 (BP Location: Left Arm)   Pulse 82   Temp 99 F (37.2 C) (Oral)   Resp 16   Ht 5\' 5"  (1.651 m)   Wt 99.8 kg   SpO2 96%   BMI 36.61 kg/m   Physical Exam Vitals and nursing Butler reviewed.  Constitutional:      General: She is not in acute distress.  Appearance: She is well-developed.     Comments: Resting comfortably in the bed in no acute distress  HENT:     Head: Normocephalic and atraumatic.  Cardiovascular:     Rate and Rhythm: Normal rate and regular rhythm.     Pulses: Normal pulses.  Pulmonary:     Effort: Pulmonary effort is normal.     Breath sounds: Normal breath sounds.     Comments: Speaking in full sentences.  Clear lung sounds in all fields. Abdominal:     General: There is no distension.     Palpations: There is no mass.     Tenderness: There is no abdominal tenderness. There is no guarding or rebound.  Musculoskeletal:        General: Normal range of motion.     Cervical back: Normal range of motion.  Skin:    General: Skin is warm.     Capillary Refill: Capillary refill takes less than 2 seconds.     Findings: No rash.  Neurological:     Mental Status: She is alert and oriented to person, place, and time.     ED Results / Procedures / Treatments   Labs (all labs ordered are listed, but only abnormal results are  displayed) Labs Reviewed - No data to display  EKG None  Radiology No results found.  Procedures Procedures (including critical care time)  Medications Ordered in ED Medications - No data to display  ED Course  I have reviewed the triage vital signs and the nursing notes.  Pertinent labs & imaging results that were available during my care of the patient were reviewed by me and considered in my medical decision making (see chart for details).    MDM Rules/Calculators/A&P                      Patient presenting with request for Covid test.  On exam, patient peers nontoxic.  She has reassuring respiratory exam.  I do not wish needs admission to hospital.  I do not believe she needs further imaging such as x-ray.  Discussed with patient that if her test is negative, due to her recent exposure, she still may test positive for develop symptoms in the next 2 weeks.  Encouraged quarantine.  As patient is having symptoms, will test for Covid.  Discussed symptomatic treatment and close monitoring for worsening respiratory status.  Return with any worsening symptoms.  At this time, patient appears safe for discharge.  Patient states she understands and agrees to plan.  Taylor Butler was evaluated in Emergency Department on 04/14/2019 for the symptoms described in the history of present illness. She was evaluated in the context of the global COVID-19 pandemic, which necessitated consideration that the patient might be at risk for infection with the SARS-CoV-2 virus that causes COVID-19. Institutional protocols and algorithms that pertain to the evaluation of patients at risk for COVID-19 are in a state of rapid change based on information released by regulatory bodies including the CDC and federal and state organizations. These policies and algorithms were followed during the patient's care in the ED.  Final Clinical Impression(s) / ED Diagnoses Final diagnoses:  None    Rx / DC Orders ED  Discharge Orders    None       Alveria Apley, PA-C 04/14/19 2115    Arby Barrette, MD 04/17/19 1051

## 2019-04-14 NOTE — Discharge Instructions (Signed)
You may have covid. This is a virus and should be treated symptomatically. Use Tylenol or ibuprofen as needed for fevers or body aches. Use cough drops or syrups as needed. Make sure you stay well-hydrated with water. Wash your hands frequently to prevent spread of infection. Make sure to quarantine/isolate so that you do not spread the infection. You are being tested for Covid.  If results are positive, you will receive a phone call.  If negative, you will not.  Either way, you may check online MyChart. Return to the emergency room if you develop chest pain, difficulty breathing, or any new or worsening symptoms.

## 2019-04-15 LAB — SARS CORONAVIRUS 2 (TAT 6-24 HRS): SARS Coronavirus 2: NEGATIVE

## 2019-05-17 IMAGING — US US PELVIS COMPLETE
1 series · 15 of 25 positions shown · non-contrast
Comparison: 06/23/2015

CLINICAL DATA: Pelvic pain

EXAM:
TRANSABDOMINAL AND TRANSVAGINAL ULTRASOUND OF PELVIS
TECHNIQUE: Both transabdominal and transvaginal ultrasound examinations of the
pelvis were performed. Transabdominal technique was performed for
global imaging of the pelvis including uterus, ovaries, adnexal
regions, and pelvic cul-de-sac. It was necessary to proceed with
endovaginal exam following the transabdominal exam to visualize the
uterus and endometrium.

[Series 1: us pelvis complete · 15 of 86 slices shown]
[im 1/86]
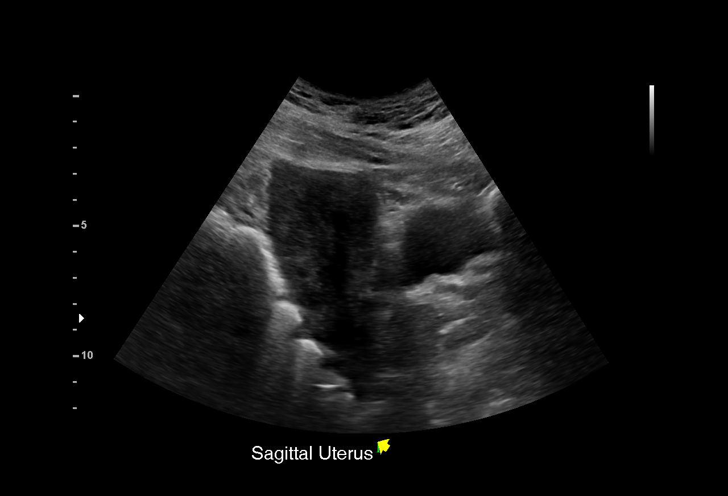
[im 8/86]
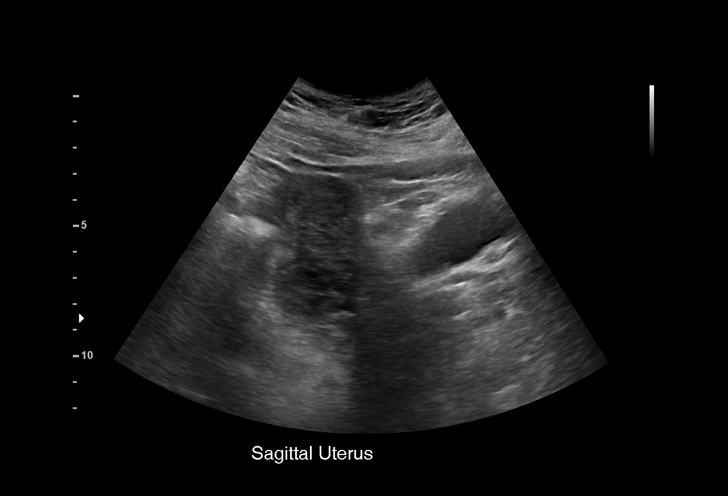
[im 15/86]
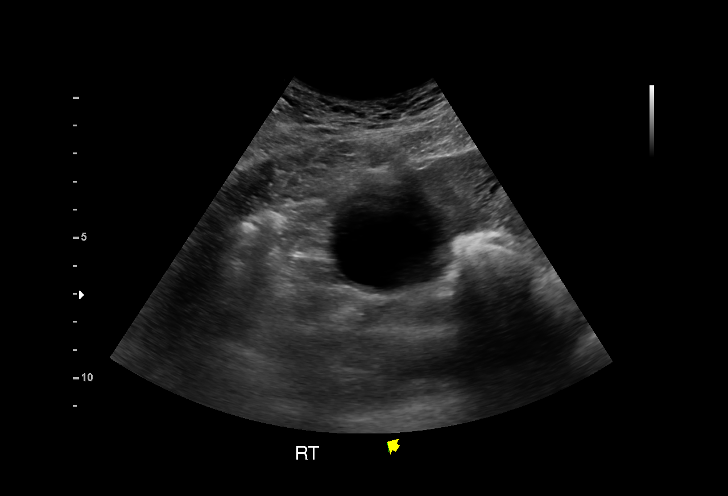
[im 18/86]
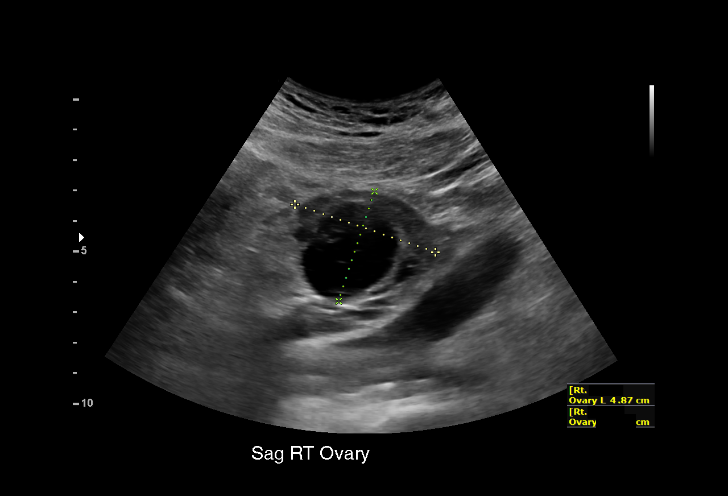
[im 25/86]
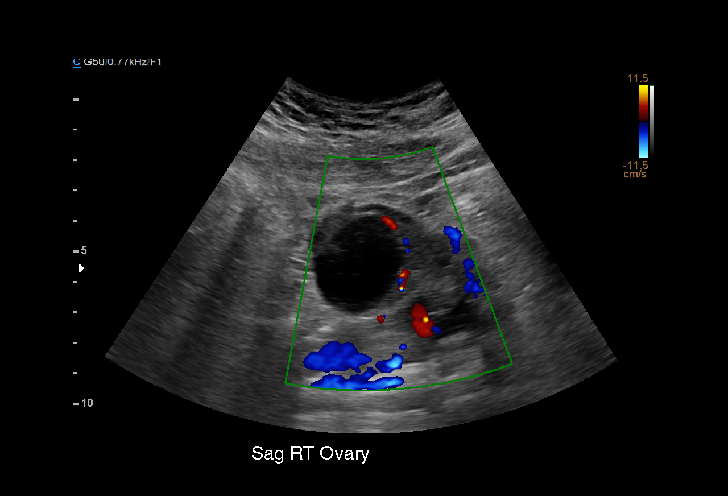
[im 32/86]
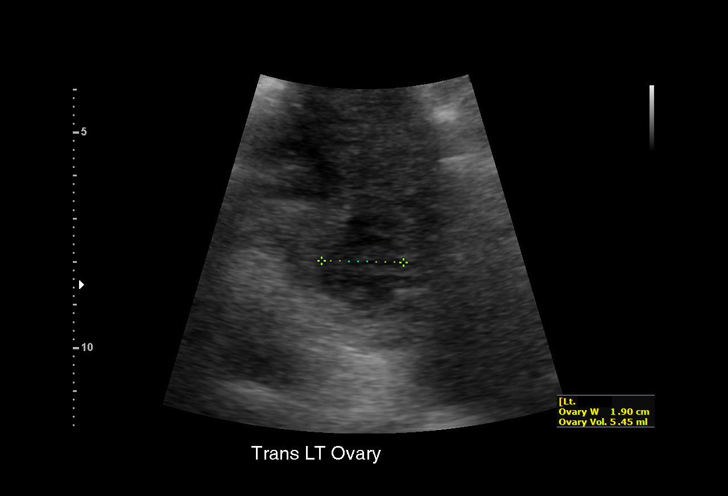
[im 36/86]
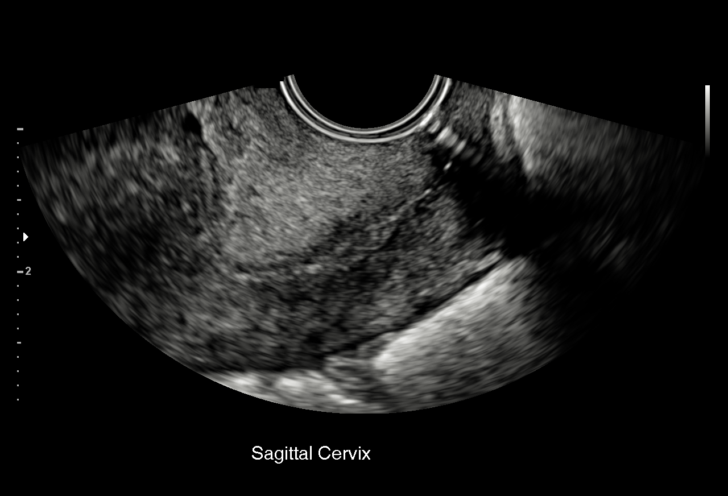
[im 43/86]
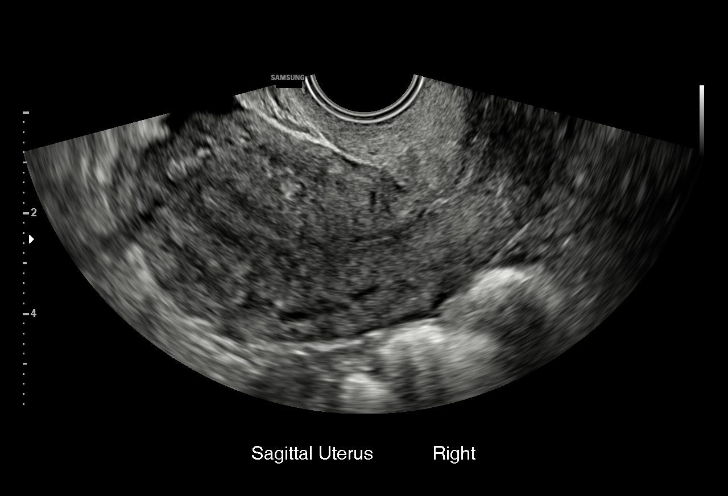
[im 50/86]
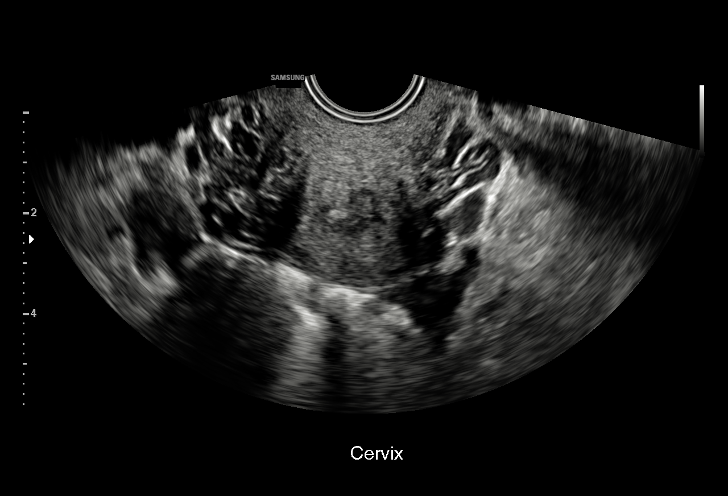
[im 54/86]
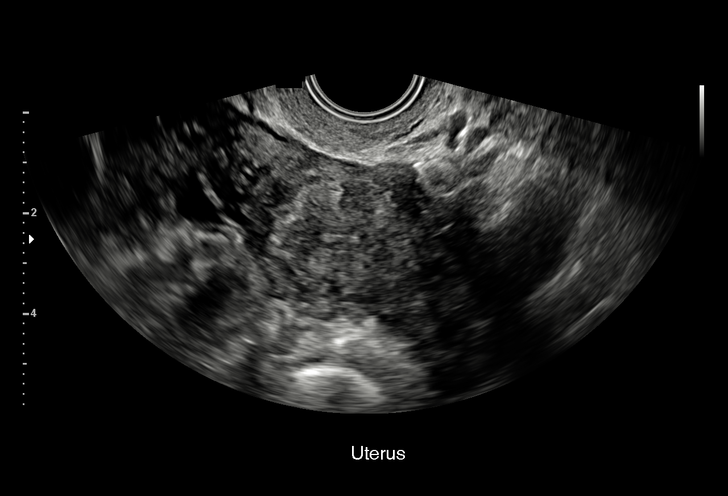
[im 61/86]
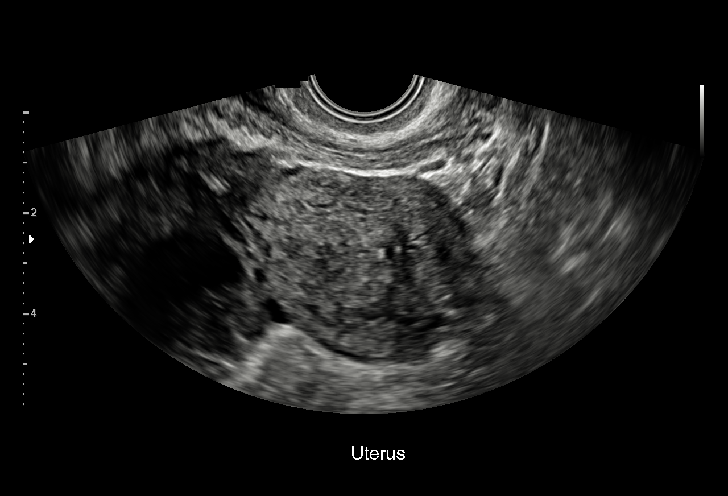
[im 68/86]
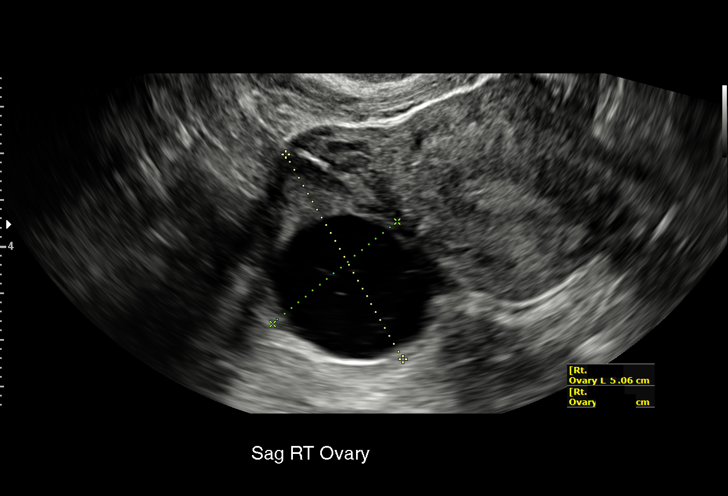
[im 71/86]
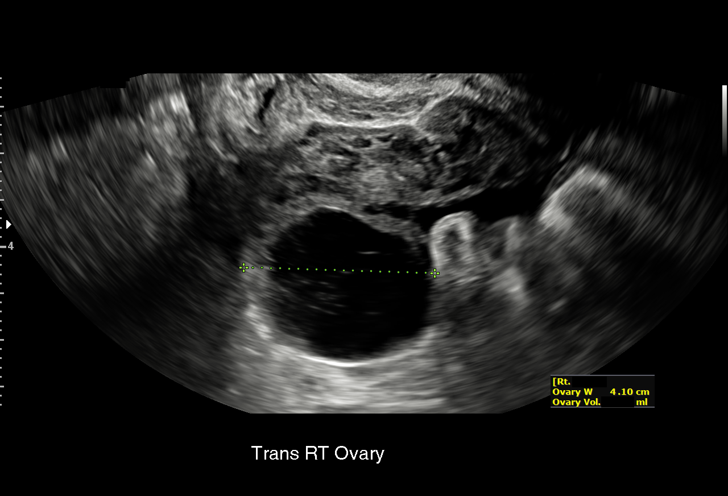
[im 78/86]
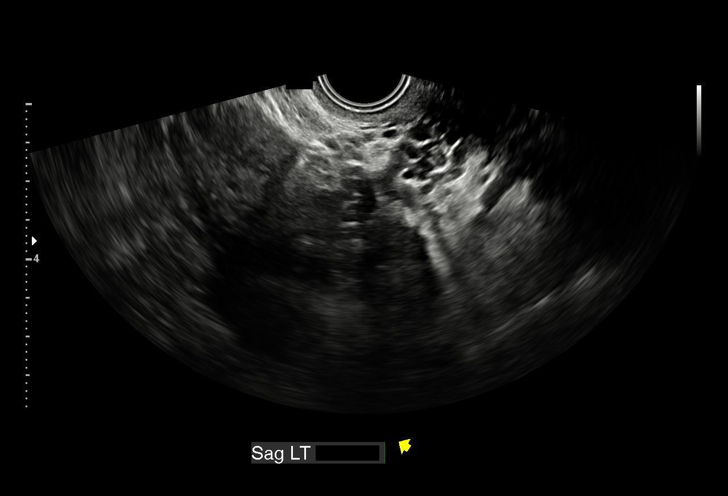
[im 86/86]
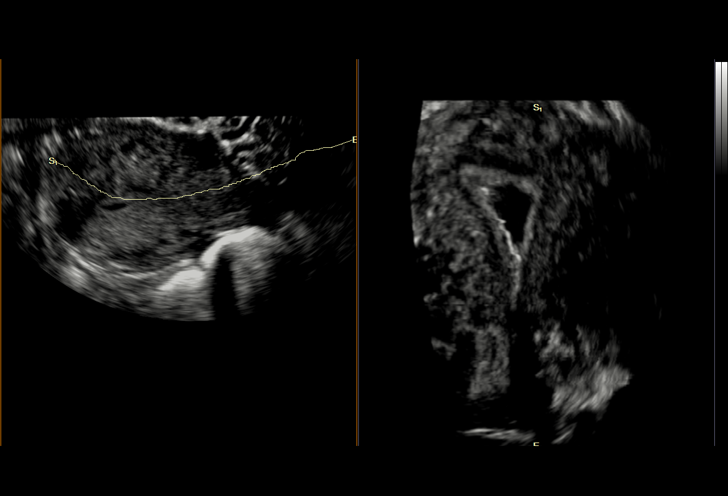

[15 of 25 positions shown; findings below may reference images not displayed]

FINDINGS: Uterus

Measurements: 10.0 x 5.0 x 5.5 cm. Normal morphology without mass

Endometrium

Thickness: 6 mm thick. Fluid within endometrial canal at the upper
uterine segment. No focal mass.

Right ovary

Measurements: 5.1 x 3.5 x 4.1 cm. Complicated cyst containing
debris/scattered internal echoes within RIGHT ovary 3.7 x 3.1 x
cm. No discrete mural nodule.

Left ovary

Measurements: 2.9 x 1.8 x 2.4 cm. Normal morphology without mass

Other findings

Small amount of nonspecific free pelvic fluid.

No adnexal masses otherwise identified.
IMPRESSION: Fluid within the endometrial canal at the upper uterine segment.

Mildly complicated RIGHT ovarian cyst 3.7 cm greatest size, new.

When compared to the previous exam, the previously identified
pattern of numerous follicles ringing the ovaries is not identified
on the current study.

## 2020-09-21 ENCOUNTER — Encounter: Payer: Medicaid Other | Admitting: Obstetrics & Gynecology

## 2020-10-15 ENCOUNTER — Other Ambulatory Visit: Payer: Self-pay

## 2020-10-15 ENCOUNTER — Encounter: Payer: Self-pay | Admitting: Obstetrics and Gynecology

## 2020-10-15 ENCOUNTER — Ambulatory Visit (INDEPENDENT_AMBULATORY_CARE_PROVIDER_SITE_OTHER): Payer: Medicaid Other | Admitting: Obstetrics and Gynecology

## 2020-10-15 ENCOUNTER — Other Ambulatory Visit (HOSPITAL_COMMUNITY)
Admission: RE | Admit: 2020-10-15 | Discharge: 2020-10-15 | Disposition: A | Discharge status: Home / Self Care | Payer: Medicaid Other | Source: Ambulatory Visit | Attending: Obstetrics and Gynecology | Admitting: Obstetrics and Gynecology

## 2020-10-15 DIAGNOSIS — Z202 Contact with and (suspected) exposure to infections with a predominantly sexual mode of transmission: Secondary | ICD-10-CM | POA: Diagnosis present

## 2020-10-15 DIAGNOSIS — N926 Irregular menstruation, unspecified: Secondary | ICD-10-CM

## 2020-10-15 DIAGNOSIS — Z01419 Encounter for gynecological examination (general) (routine) without abnormal findings: Secondary | ICD-10-CM | POA: Diagnosis present

## 2020-10-15 MED ORDER — DESOGESTREL-ETHINYL ESTRADIOL 0.15-30 MG-MCG PO TABS: 1.0000 | ORAL_TABLET | Freq: Every day | ORAL | 11 refills | Status: AC

## 2020-10-15 NOTE — Progress Notes (Signed)
Last pap 2020 St[facility] testing today

## 2020-10-15 NOTE — Patient Instructions (Signed)
Health Maintenance, Female Adopting a healthy lifestyle and getting preventive care are important in promoting health and wellness. Ask your health care provider about: The right schedule for you to have regular tests and exams. Things you can do on your own to prevent diseases and keep yourself healthy. What should I know about diet, weight, and exercise? Eat a healthy diet  Eat a diet that includes plenty of vegetables, fruits, low-fat dairy products, and lean protein. Do not eat a lot of foods that are high in solid fats, added sugars, or sodium. Maintain a healthy weight Body mass index (BMI) is used to identify weight problems. It estimates body fat based on height and weight. Your health care provider can help determine your BMI and help you achieve or maintain a healthy weight. Get regular exercise Get regular exercise. This is one of the most important things you can do for your health. Most adults should: Exercise for at least 150 minutes each week. The exercise should increase your heart rate and make you sweat (moderate-intensity exercise). Do strengthening exercises at least twice a week. This is in addition to the moderate-intensity exercise. Spend less time sitting. Even light physical activity can be beneficial. Watch cholesterol and blood lipids Have your blood tested for lipids and cholesterol at 34 years of age, then have this test every 5 years. Have your cholesterol levels checked more often if: Your lipid or cholesterol levels are high. You are older than 34 years of age. You are at high risk for heart disease. What should I know about cancer screening? Depending on your health history and family history, you may need to have cancer screening at various ages. This may include screening for: Breast cancer. Cervical cancer. Colorectal cancer. Skin cancer. Lung cancer. What should I know about heart disease, diabetes, and high blood pressure? Blood pressure and heart  disease High blood pressure causes heart disease and increases the risk of stroke. This is more likely to develop in people who have high blood pressure readings, are of African descent, or are overweight. Have your blood pressure checked: Every 3-5 years if you are 18-39 years of age. Every year if you are 40 years old or older. Diabetes Have regular diabetes screenings. This checks your fasting blood sugar level. Have the screening done: Once every three years after age 40 if you are at a normal weight and have a low risk for diabetes. More often and at a younger age if you are overweight or have a high risk for diabetes. What should I know about preventing infection? Hepatitis B If you have a higher risk for hepatitis B, you should be screened for this virus. Talk with your health care provider to find out if you are at risk for hepatitis B infection. Hepatitis C Testing is recommended for: Everyone born from 1945 through 1965. Anyone with known risk factors for hepatitis C. Sexually transmitted infections (STIs) Get screened for STIs, including gonorrhea and chlamydia, if: You are sexually active and are younger than 34 years of age. You are older than 34 years of age and your health care provider tells you that you are at risk for this type of infection. Your sexual activity has changed since you were last screened, and you are at increased risk for chlamydia or gonorrhea. Ask your health care provider if you are at risk. Ask your health care provider about whether you are at high risk for HIV. Your health care provider may recommend a prescription medicine   to help prevent HIV infection. If you choose to take medicine to prevent HIV, you should first get tested for HIV. You should then be tested every 3 months for as long as you are taking the medicine. Pregnancy If you are about to stop having your period (premenopausal) and you may become pregnant, seek counseling before you get  pregnant. Take 400 to 800 micrograms (mcg) of folic acid every day if you become pregnant. Ask for birth control (contraception) if you want to prevent pregnancy. Osteoporosis and menopause Osteoporosis is a disease in which the bones lose minerals and strength with aging. This can result in bone fractures. If you are 65 years old or older, or if you are at risk for osteoporosis and fractures, ask your health care provider if you should: Be screened for bone loss. Take a calcium or vitamin D supplement to lower your risk of fractures. Be given hormone replacement therapy (HRT) to treat symptoms of menopause. Follow these instructions at home: Lifestyle Do not use any products that contain nicotine or tobacco, such as cigarettes, e-cigarettes, and chewing tobacco. If you need help quitting, ask your health care provider. Do not use street drugs. Do not share needles. Ask your health care provider for help if you need support or information about quitting drugs. Alcohol use Do not drink alcohol if: Your health care provider tells you not to drink. You are pregnant, may be pregnant, or are planning to become pregnant. If you drink alcohol: Limit how much you use to 0-1 drink a day. Limit intake if you are breastfeeding. Be aware of how much alcohol is in your drink. In the [place] one drink equals one 12 oz bottle of beer (355 mL), one 5 oz glass of wine (148 mL), or one 1 oz glass of hard liquor (44 mL). General instructions Schedule regular health, dental, and eye exams. Stay current with your vaccines. Tell your health care provider if: You often feel depressed. You have ever been abused or do not feel safe at home. Summary Adopting a healthy lifestyle and getting preventive care are important in promoting health and wellness. Follow your health care provider's instructions about healthy diet, exercising, and getting tested or screened for diseases. Follow your health care provider's  instructions on monitoring your cholesterol and blood pressure. This information is not intended to replace advice given to you by your health care provider. Make sure you discuss any questions you have with your health care provider. Document Revised: 03/12/2020 Document Reviewed: 12/26/2017 Elsevier Patient Education  2022 Elsevier Inc.  

## 2020-10-15 NOTE — Progress Notes (Addendum)
Patient ID: Taylor Butler, female   DOB: Feb 18, 1985, 35 y.o.   MRN: [id number]  MARIN WISNER is a 35 y.o. [id number] female here for a routine annual gynecologic exam.  Current complaints: monthly cycles do not occur at the same time and heavy at times.   Denies  discharge, pelvic pain, problems with intercourse or other gynecologic concerns.    Gynecologic History Patient's last menstrual period was 09/22/2020 (exact date). Contraception: tubal ligation Last Pap: Uncertain. Results were: normal Last mammogram: NA.   Obstetric History OB History  Gravida Para Term Preterm AB Living  7 5 5   2 5   SAB IAB Ectopic Multiple Live Births  2       5    # Outcome Date GA Lbr Len/2nd Weight Sex Delivery Anes PTL Lv  7 SAB           6 SAB      SAB     5 Term      Vag-Spont     4 Term      Vag-Spont     3 Term      Vag-Spont     2 Term      Vag-Spont     1 Term      CS-LTranv       Past Medical History:  Diagnosis Date   Ovarian cyst     Past Surgical History:  Procedure Laterality Date   CESAREAN SECTION     TUBAL LIGATION      Current Outpatient Medications on File Prior to Visit  Medication Sig Dispense Refill   ibuprofen (ADVIL,MOTRIN) 800 MG tablet Take 1 tablet (800 mg total) every 8 (eight) hours as needed by mouth for moderate pain. 30 tablet 0   lidocaine (XYLOCAINE) 2 % solution Use as directed 15 mLs in the mouth or throat as needed for mouth pain. 100 mL 2   No current facility-administered medications on file prior to visit.    No Known Allergies  Social History   Socioeconomic History   Marital status: Single    Spouse name: Not on file   Number of children: Not on file   Years of education: Not on file   Highest education level: Not on file  Occupational History   Not on file  Tobacco Use   Smoking status: Former    Types: Cigars   Smokeless tobacco: Never  Substance and Sexual Activity   Alcohol use: No   Drug use: Yes    Frequency: 7.0 times per  week    Types: Marijuana, Cocaine   Sexual activity: Yes    Birth control/protection: Surgical  Other Topics Concern   Not on file  Social History Narrative   Not on file   Social Determinants of Health   Financial Resource Strain: Not on file  Food Insecurity: Not on file  Transportation Needs: Not on file  Physical Activity: Not on file  Stress: Not on file  Social Connections: Not on file  Intimate Partner Violence: Not on file    Family History  Problem Relation Age of Onset   Diabetes Father    Cancer Maternal Grandmother     The following portions of the patient's history were reviewed and updated as appropriate: allergies, current medications, past family history, past medical history, past social history, past surgical history and problem list.  Review of Systems Pertinent items noted in HPI and remainder of comprehensive ROS otherwise negative.   Objective:  BP 133/85   Pulse 73   Wt 230 lb (104.3 kg)   LMP 09/22/2020 (Exact Date)   BMI 38.27 kg/m   Chaperone present during exam  CONSTITUTIONAL: Well-developed, well-nourished female in no acute distress.  HENT:  Normocephalic, atraumatic, External right and left ear normal. Oropharynx is clear and moist EYES: Conjunctivae and EOM are normal. Pupils are equal, round, and reactive to light. No scleral icterus.  NECK: Normal range of motion, supple, no masses.  Normal thyroid.  SKIN: Skin is warm and dry. No rash noted. Not diaphoretic. No erythema. No pallor. NEUROLGIC: Alert and oriented to person, place, and time. Normal reflexes, muscle tone coordination. No cranial nerve deficit noted. PSYCHIATRIC: Normal mood and affect. Normal behavior. Normal judgment and thought content. CARDIOVASCULAR: Normal heart rate noted, regular rhythm RESPIRATORY: Clear to auscultation bilaterally. Effort and breath sounds normal, no problems with respiration noted. BREASTS: Symmetric in size. No masses, skin changes, nipple  drainage, or lymphadenopathy. ABDOMEN: Soft, normal bowel sounds, no distention noted.  No tenderness, rebound or guarding.  PELVIC: Normal appearing external genitalia; normal appearing vaginal mucosa and cervix.  No abnormal discharge noted.  Pap smear obtained.  Normal uterine size, no other palpable masses, no uterine or adnexal tenderness. MUSCULOSKELETAL: Normal range of motion. No tenderness.  No cyanosis, clubbing, or edema.  2+ distal pulses.   Assessment:  Annual gynecologic examination with pap smear  Irregular cycles STD exposure Plan:  Will follow up results of pap smear and manage accordingly. STD testing as per pt request. Will try OCP's to help regulate cycles. U/R/B reviewed. F/u in 3-4 months to gage response to OCP's Routine preventative health maintenance measures emphasized. Please refer to After Visit Summary for other counseling recommendations.    Hermina Staggers, MD, FACOG Attending Obstetrician & Gynecologist Center for [facility], [facility]

## 2020-10-16 LAB — RPR: RPR Ser Ql: NONREACTIVE

## 2020-10-16 LAB — HEPATITIS C ANTIBODY: Hep C Virus Ab: <0.1 s/co ratio (ref 0.0–0.9)

## 2020-10-16 LAB — HEPATITIS B SURFACE ANTIGEN: Hepatitis B Surface Ag: NEGATIVE

## 2020-10-16 LAB — HIV ANTIBODY (ROUTINE TESTING W REFLEX): HIV Screen 4th Generation wRfx: NONREACTIVE

## 2020-10-18 ENCOUNTER — Other Ambulatory Visit: Payer: Self-pay

## 2020-10-18 DIAGNOSIS — A599 Trichomoniasis, unspecified: Secondary | ICD-10-CM

## 2020-10-18 DIAGNOSIS — B3731 Acute candidiasis of vulva and vagina: Secondary | ICD-10-CM

## 2020-10-18 DIAGNOSIS — B9689 Other specified bacterial agents as the cause of diseases classified elsewhere: Secondary | ICD-10-CM

## 2020-10-18 LAB — CERVICOVAGINAL ANCILLARY ONLY
Bacterial Vaginitis (gardnerella): POSITIVE — AB
Candida Glabrata: NEGATIVE
Candida Vaginitis: NEGATIVE
Chlamydia: POSITIVE — AB
Comment: NEGATIVE
Comment: NEGATIVE
Comment: NEGATIVE
Comment: NEGATIVE
Comment: NEGATIVE
Comment: NORMAL
Neisseria Gonorrhea: NEGATIVE
Trichomonas: POSITIVE — AB

## 2020-10-18 MED ORDER — METRONIDAZOLE 500 MG PO TABS: 500.0000 mg | ORAL_TABLET | Freq: Two times a day (BID) | ORAL | 0 refills | Status: DC

## 2020-10-18 MED ORDER — FLUCONAZOLE 150 MG PO TABS
150.0000 mg | ORAL_TABLET | Freq: Once | ORAL | 0 refills | Status: AC
Start: 2020-10-18 — End: 2020-10-18

## 2020-10-19 ENCOUNTER — Other Ambulatory Visit: Payer: Self-pay

## 2020-10-19 DIAGNOSIS — A749 Chlamydial infection, unspecified: Secondary | ICD-10-CM

## 2020-10-19 MED ORDER — AZITHROMYCIN 500 MG PO TABS: 1000.0000 mg | ORAL_TABLET | Freq: Once | ORAL | 1 refills | Status: AC

## 2020-10-21 LAB — CYTOLOGY - PAP
Comment: NEGATIVE
Diagnosis: NEGATIVE
Diagnosis: REACTIVE
High risk HPV: NEGATIVE

## 2021-03-23 ENCOUNTER — Ambulatory Visit (INDEPENDENT_AMBULATORY_CARE_PROVIDER_SITE_OTHER): Payer: Medicaid Other | Admitting: *Deleted

## 2021-03-23 ENCOUNTER — Encounter: Payer: Medicaid Other | Admitting: Obstetrics and Gynecology

## 2021-03-23 ENCOUNTER — Other Ambulatory Visit (HOSPITAL_COMMUNITY)
Admission: RE | Admit: 2021-03-23 | Discharge: 2021-03-23 | Disposition: A | Discharge status: Home / Self Care | Payer: Medicaid Other | Source: Ambulatory Visit | Attending: Obstetrics and Gynecology | Admitting: Obstetrics and Gynecology

## 2021-03-23 ENCOUNTER — Other Ambulatory Visit: Payer: Self-pay

## 2021-03-23 VITALS — BP 145/92 | HR 83

## 2021-03-23 DIAGNOSIS — Z3200 Encounter for pregnancy test, result unknown: Secondary | ICD-10-CM

## 2021-03-23 DIAGNOSIS — Z113 Encounter for screening for infections with a predominantly sexual mode of transmission: Secondary | ICD-10-CM

## 2021-03-23 DIAGNOSIS — Z3202 Encounter for pregnancy test, result negative: Secondary | ICD-10-CM

## 2021-03-23 LAB — POCT URINE PREGNANCY: Preg Test, Ur: NEGATIVE

## 2021-03-23 NOTE — Addendum Note (Signed)
Addended by: Harrel Lemon on: 03/23/2021 05:09 PM ? ? Modules accepted: Orders ? ?
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
## 2021-03-24 LAB — RPR: RPR Ser Ql: NONREACTIVE

## 2021-03-24 LAB — HEPATITIS B SURFACE ANTIGEN: Hepatitis B Surface Ag: NEGATIVE

## 2021-03-24 LAB — HIV ANTIBODY (ROUTINE TESTING W REFLEX): HIV Screen 4th Generation wRfx: NONREACTIVE

## 2021-03-24 LAB — HEPATITIS C ANTIBODY: Hep C Virus Ab: NONREACTIVE

## 2021-03-25 ENCOUNTER — Other Ambulatory Visit: Payer: Self-pay | Admitting: Obstetrics and Gynecology

## 2021-03-25 DIAGNOSIS — B9689 Other specified bacterial agents as the cause of diseases classified elsewhere: Secondary | ICD-10-CM

## 2021-03-25 DIAGNOSIS — A599 Trichomoniasis, unspecified: Secondary | ICD-10-CM

## 2021-03-25 LAB — CERVICOVAGINAL ANCILLARY ONLY
Bacterial Vaginitis (gardnerella): POSITIVE — AB
Candida Glabrata: NEGATIVE
Candida Vaginitis: NEGATIVE
Chlamydia: POSITIVE — AB
Comment: NEGATIVE
Comment: NEGATIVE
Comment: NEGATIVE
Comment: NEGATIVE
Comment: NEGATIVE
Comment: NORMAL
Neisseria Gonorrhea: NEGATIVE
Trichomonas: NEGATIVE

## 2021-03-28 ENCOUNTER — Encounter: Payer: Self-pay | Admitting: *Deleted

## 2021-03-28 ENCOUNTER — Other Ambulatory Visit: Payer: Self-pay | Admitting: *Deleted

## 2021-03-28 DIAGNOSIS — B9689 Other specified bacterial agents as the cause of diseases classified elsewhere: Secondary | ICD-10-CM

## 2021-03-28 DIAGNOSIS — A749 Chlamydial infection, unspecified: Secondary | ICD-10-CM

## 2021-03-28 MED ORDER — DOXYCYCLINE HYCLATE 100 MG PO CAPS: 100.0000 mg | ORAL_CAPSULE | Freq: Two times a day (BID) | ORAL | 0 refills | Status: DC

## 2021-03-28 MED ORDER — METRONIDAZOLE 500 MG PO TABS: 500.0000 mg | ORAL_TABLET | Freq: Two times a day (BID) | ORAL | 0 refills | Status: DC
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
## 2021-07-13 ENCOUNTER — Ambulatory Visit: Payer: Medicaid Other | Admitting: Obstetrics and Gynecology

## 2021-08-17 ENCOUNTER — Ambulatory Visit: Payer: Medicaid Other | Admitting: Obstetrics and Gynecology

## 2021-08-29 ENCOUNTER — Ambulatory Visit: Payer: Medicaid Other | Admitting: Obstetrics and Gynecology

## 2021-08-30 ENCOUNTER — Ambulatory Visit: Payer: Medicaid Other | Admitting: Obstetrics and Gynecology

## 2021-11-13 ENCOUNTER — Emergency Department (HOSPITAL_COMMUNITY)
Admission: EM | Admit: 2021-11-13 | Discharge: 2021-11-14 | Discharge status: Against Medical Advice | Payer: Medicaid Other | Attending: Emergency Medicine | Admitting: Emergency Medicine

## 2021-11-13 ENCOUNTER — Emergency Department (HOSPITAL_COMMUNITY): Payer: Medicaid Other

## 2021-11-13 DIAGNOSIS — Z5321 Procedure and treatment not carried out due to patient leaving prior to being seen by health care provider: Secondary | ICD-10-CM | POA: Diagnosis not present

## 2021-11-13 DIAGNOSIS — R519 Headache, unspecified: Secondary | ICD-10-CM | POA: Diagnosis not present

## 2021-11-13 DIAGNOSIS — H538 Other visual disturbances: Secondary | ICD-10-CM | POA: Insufficient documentation

## 2021-11-13 DIAGNOSIS — M25519 Pain in unspecified shoulder: Secondary | ICD-10-CM | POA: Diagnosis not present

## 2021-11-13 DIAGNOSIS — R109 Unspecified abdominal pain: Secondary | ICD-10-CM | POA: Diagnosis not present
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
## 2021-11-14 NOTE — ED Notes (Signed)
X2 no response for vitals recheck  

## 2023-07-04 ENCOUNTER — Ambulatory Visit
Admission: EM | Admit: 2023-07-04 | Discharge: 2023-07-04 | Discharge status: Against Medical Advice | Attending: Nurse Practitioner | Admitting: Nurse Practitioner

## 2023-07-04 ENCOUNTER — Encounter: Payer: Self-pay | Admitting: Emergency Medicine

## 2023-07-04 DIAGNOSIS — Z113 Encounter for screening for infections with a predominantly sexual mode of transmission: Secondary | ICD-10-CM | POA: Diagnosis present

## 2023-07-04 DIAGNOSIS — N898 Other specified noninflammatory disorders of vagina: Secondary | ICD-10-CM | POA: Diagnosis present

## 2023-07-04 DIAGNOSIS — R350 Frequency of micturition: Secondary | ICD-10-CM | POA: Insufficient documentation

## 2023-07-04 DIAGNOSIS — Z5321 Procedure and treatment not carried out due to patient leaving prior to being seen by health care provider: Secondary | ICD-10-CM

## 2023-07-04 DIAGNOSIS — Z3201 Encounter for pregnancy test, result positive: Secondary | ICD-10-CM | POA: Diagnosis not present

## 2023-07-04 LAB — POCT URINALYSIS DIP (MANUAL ENTRY)
Bilirubin, UA: NEGATIVE
Glucose, UA: NEGATIVE mg/dL
Ketones, POC UA: NEGATIVE mg/dL
Nitrite, UA: POSITIVE — AB
Protein Ur, POC: 30 mg/dL — AB
Spec Grav, UA: 1.02 (ref 1.010–1.025)
Urobilinogen, UA: 0.2 U/dL
pH, UA: 6.5 (ref 5.0–8.0)

## 2023-07-04 LAB — POCT URINE PREGNANCY: Preg Test, Ur: NEGATIVE

## 2023-07-04 NOTE — ED Triage Notes (Signed)
 Pt presents c/o urinary frequency x 4 days and vaginal discharge that she says smells like bleach x 2 days as well as a slight itch.

## 2023-07-05 ENCOUNTER — Ambulatory Visit
Admission: EM | Admit: 2023-07-05 | Discharge: 2023-07-05 | Disposition: A | Discharge status: Home / Self Care | Attending: Physician Assistant | Admitting: Physician Assistant

## 2023-07-05 ENCOUNTER — Ambulatory Visit (HOSPITAL_COMMUNITY): Payer: Self-pay

## 2023-07-05 ENCOUNTER — Encounter: Payer: Self-pay | Admitting: Emergency Medicine

## 2023-07-05 DIAGNOSIS — N3000 Acute cystitis without hematuria: Secondary | ICD-10-CM | POA: Diagnosis not present

## 2023-07-05 DIAGNOSIS — N898 Other specified noninflammatory disorders of vagina: Secondary | ICD-10-CM

## 2023-07-05 LAB — CERVICOVAGINAL ANCILLARY ONLY
Bacterial Vaginitis (gardnerella): POSITIVE — AB
Candida Glabrata: NEGATIVE
Candida Vaginitis: POSITIVE — AB
Chlamydia: NEGATIVE
Comment: NEGATIVE
Comment: NEGATIVE
Comment: NEGATIVE
Comment: NEGATIVE
Comment: NEGATIVE
Comment: NORMAL
Neisseria Gonorrhea: NEGATIVE
Trichomonas: NEGATIVE

## 2023-07-05 MED ORDER — METRONIDAZOLE 500 MG PO TABS: 500.0000 mg | ORAL_TABLET | Freq: Two times a day (BID) | ORAL | 0 refills | Status: AC

## 2023-07-05 MED ORDER — NITROFURANTOIN MONOHYD MACRO 100 MG PO CAPS: 100.0000 mg | ORAL_CAPSULE | Freq: Two times a day (BID) | ORAL | 0 refills | Status: AC

## 2023-07-05 MED ORDER — FLUCONAZOLE 150 MG PO TABS: 150.0000 mg | ORAL_TABLET | Freq: Once | ORAL | 0 refills | Status: AC

## 2023-07-05 NOTE — ED Provider Notes (Signed)
 [facility]    CSN: [id number] Arrival date & time: 07/05/23  1014      History   Chief Complaint Chief Complaint  Patient presents with   SEXUALLY TRANSMITTED DISEASE    HPI Taylor Butler is a 38 y.o. female.   Patient presents today for evaluation of urinary frequency and dysuria that started 4 days ago.  She has also reported some vaginal discharge.  She has had some slight vaginal itching.  She was checked in yesterday and had labs collected but was unable to wait to be seen by provider.  UA results reviewed and cytology results still pending.  The history is provided by the patient.    Past Medical History:  Diagnosis Date   Ovarian cyst     Patient Active Problem List   Diagnosis Date Noted   Visit for routine gyn exam 10/15/2020   Possible exposure to STD 10/15/2020   Irregular menstrual cycle 10/15/2020    Past Surgical History:  Procedure Laterality Date   CESAREAN SECTION     TUBAL LIGATION      OB History     Gravida  7   Para  5   Term  5   Preterm      AB  2   Living  5      SAB  2   IAB      Ectopic      Multiple      Live Births  5            Home Medications    Prior to Admission medications   Medication Sig Start Date End Date Taking? Authorizing Provider  nitrofurantoin, macrocrystal-monohydrate, (MACROBID) 100 MG capsule Take 1 capsule (100 mg total) by mouth 2 (two) times daily. 07/05/23  Yes Vernestine Gondola, PA-C  desogestrel -ethinyl estradiol  (APRI ) 0.15-30 MG-MCG tablet Take 1 tablet by mouth daily. 10/15/20 10/15/21  Ervin, Michael L, MD  ibuprofen  (ADVIL ,MOTRIN ) 800 MG tablet Take 1 tablet (800 mg total) every 8 (eight) hours as needed by mouth for moderate pain. 12/03/16   Kit Peri, CNM  lidocaine  (XYLOCAINE ) 2 % solution Use as directed 15 mLs in the mouth or throat as needed for mouth pain. 03/17/19   Joy, Zackery Herring, PA-C    Family History Family History  Problem Relation Age of Onset    Diabetes Father    Cancer Maternal Grandmother     Social History Social History   Tobacco Use   Smoking status: Former    Types: Gaffer exposure: Current   Smokeless tobacco: Never  Vaping Use   Vaping status: Never Used  Substance Use Topics   Alcohol use: No   Drug use: Yes    Frequency: 7.0 times per week    Types: Marijuana, Cocaine     Allergies   Patient has no known allergies.   Review of Systems Review of Systems  Constitutional:  Negative for chills and fever.  Eyes:  Negative for discharge and redness.  Respiratory:  Negative for shortness of breath.   Gastrointestinal:  Negative for abdominal pain, nausea and vomiting.  Genitourinary:  Positive for dysuria, frequency and vaginal discharge.     Physical Exam Triage Vital Signs ED Triage Vitals  Encounter Vitals Group     BP 07/05/23 1050 132/85     Girls Systolic BP Percentile --      Girls Diastolic BP Percentile --      Boys Systolic  BP Percentile --      Boys Diastolic BP Percentile --      Pulse Rate 07/05/23 1050 65     Resp 07/05/23 1050 18     Temp 07/05/23 1050 98.1 F (36.7 C)     Temp Source 07/05/23 1050 Oral     SpO2 07/05/23 1050 98 %     Weight 07/05/23 1049 229 lb 15 oz (104.3 kg)     Height --      Head Circumference --      Peak Flow --      Pain Score 07/05/23 1049 0     Pain Loc --      Pain Education --      Exclude from Growth Chart --    No data found.  Updated Vital Signs BP 132/85 (BP Location: Left Arm)   Pulse 65   Temp 98.1 F (36.7 C) (Oral)   Resp 18   Wt 229 lb 15 oz (104.3 kg)   LMP 07/02/2023 (Approximate)   SpO2 98%   BMI 38.26 kg/m   Visual Acuity Right Eye Distance:   Left Eye Distance:   Bilateral Distance:    Right Eye Near:   Left Eye Near:    Bilateral Near:     Physical Exam Vitals and nursing note reviewed.  Constitutional:      General: She is not in acute distress.    Appearance: Normal appearance. She is not  ill-appearing.  HENT:     Head: Normocephalic and atraumatic.   Eyes:     Conjunctiva/sclera: Conjunctivae normal.    Cardiovascular:     Rate and Rhythm: Normal rate.  Pulmonary:     Effort: Pulmonary effort is normal. No respiratory distress.   Neurological:     Mental Status: She is alert.   Psychiatric:        Mood and Affect: Mood normal.        Behavior: Behavior normal.        Thought Content: Thought content normal.      UC Treatments / Results  Labs (all labs ordered are listed, but only abnormal results are displayed) Labs Reviewed - No data to display  EKG   Radiology No results found.  Procedures Procedures (including critical care time)  Medications Ordered in UC Medications - No data to display  Initial Impression / Assessment and Plan / UC Course  I have reviewed the triage vital signs and the nursing notes.  Pertinent labs & imaging results that were available during my care of the patient were reviewed by me and considered in my medical decision making (see chart for details).     Will treat to cover UTI with Macrobid.  Recommended she abstain from sexual activity while awaiting cytology results.  Encouraged follow-up if no gradual improvement or with any further concerns.   Final Clinical Impressions(s) / UC Diagnoses   Final diagnoses:  Acute cystitis without hematuria  Vaginal discharge   Discharge Instructions   None    ED Prescriptions     Medication Sig Dispense Auth. Provider   nitrofurantoin, macrocrystal-monohydrate, (MACROBID) 100 MG capsule Take 1 capsule (100 mg total) by mouth 2 (two) times daily. 10 capsule Vernestine Gondola, PA-C      PDMP not reviewed this encounter.   Vernestine Gondola, PA-C 07/05/23 1123

## 2023-07-05 NOTE — ED Triage Notes (Signed)
 Pt presents c/o urinary frequency x 4 days and vaginal discharge that she says smells like bleach x 2 days as well as a slight itch. - from yesterday's triage

## 2024-01-06 ENCOUNTER — Emergency Department (HOSPITAL_COMMUNITY)

## 2024-01-06 ENCOUNTER — Emergency Department (HOSPITAL_COMMUNITY)
Admission: EM | Admit: 2024-01-06 | Discharge: 2024-01-06 | Disposition: A | Discharge status: Home / Self Care | Source: Home / Self Care | Attending: Emergency Medicine | Admitting: Emergency Medicine

## 2024-01-06 ENCOUNTER — Other Ambulatory Visit: Payer: Self-pay

## 2024-01-06 DIAGNOSIS — Y92410 Unspecified street and highway as the place of occurrence of the external cause: Secondary | ICD-10-CM | POA: Diagnosis not present

## 2024-01-06 DIAGNOSIS — M25511 Pain in right shoulder: Secondary | ICD-10-CM | POA: Insufficient documentation

## 2024-01-06 DIAGNOSIS — M79672 Pain in left foot: Secondary | ICD-10-CM | POA: Insufficient documentation

## 2024-01-06 MED ORDER — KETOROLAC TROMETHAMINE 15 MG/ML IJ SOLN
15.0000 mg | Freq: Once | INTRAMUSCULAR | Status: AC
  Administered 2024-01-06: 15 mg via INTRAMUSCULAR
  Filled 2024-01-06: qty 1
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
## 2024-03-28 ENCOUNTER — Ambulatory Visit: Admitting: Student
